# Patient Record
Sex: Female | Born: 1965
Health system: Southern US, Community
[De-identification: ages and names within clinical notes are randomized; demographics above are authoritative.]

## PROBLEM LIST (undated history)

## (undated) DIAGNOSIS — M7070 Other bursitis of hip, unspecified hip: Secondary | ICD-10-CM

## (undated) DIAGNOSIS — Q51818 Other congenital malformations of uterus: Secondary | ICD-10-CM

## (undated) DIAGNOSIS — I10 Essential (primary) hypertension: Secondary | ICD-10-CM

## (undated) DIAGNOSIS — N83209 Unspecified ovarian cyst, unspecified side: Secondary | ICD-10-CM

## (undated) DIAGNOSIS — I350 Nonrheumatic aortic (valve) stenosis: Secondary | ICD-10-CM

## (undated) HISTORY — PX: ROSS KONNO PROCEDURE: SHX2364

## (undated) HISTORY — DX: Unspecified ovarian cyst, unspecified side: N83.209

## (undated) HISTORY — PX: PELVIC LAPAROSCOPY: SHX162

## (undated) HISTORY — PX: CARDIAC VALVE SURGERY: SHX40

## (undated) HISTORY — PX: WRIST SURGERY: SHX841

## (undated) HISTORY — DX: Other bursitis of hip, unspecified hip: M70.70

## (undated) HISTORY — PX: CARDIAC CATHETERIZATION: SHX172

## (undated) HISTORY — DX: Essential (primary) hypertension: I10

## (undated) HISTORY — DX: Other congenital malformations of uterus: Q51.818

---

## 1999-01-25 ENCOUNTER — Inpatient Hospital Stay (HOSPITAL_COMMUNITY): Admission: AD | Admit: 1999-01-25 | Discharge: 1999-01-25 | Payer: Self-pay | Admitting: Obstetrics & Gynecology

## 2000-02-22 ENCOUNTER — Other Ambulatory Visit: Admission: RE | Admit: 2000-02-22 | Discharge: 2000-02-22 | Payer: Self-pay | Admitting: Obstetrics and Gynecology

## 2000-03-28 ENCOUNTER — Other Ambulatory Visit: Admission: RE | Admit: 2000-03-28 | Discharge: 2000-03-28 | Payer: Self-pay | Admitting: Obstetrics and Gynecology

## 2000-07-05 HISTORY — PX: CARDIAC SURGERY: SHX584

## 2000-07-19 ENCOUNTER — Other Ambulatory Visit: Admission: RE | Admit: 2000-07-19 | Discharge: 2000-07-19 | Payer: Self-pay | Admitting: Obstetrics and Gynecology

## 2001-06-05 ENCOUNTER — Encounter: Payer: Self-pay | Admitting: *Deleted

## 2001-06-05 ENCOUNTER — Emergency Department (HOSPITAL_COMMUNITY): Admission: EM | Admit: 2001-06-05 | Discharge: 2001-06-05 | Payer: Self-pay | Admitting: *Deleted

## 2001-06-20 ENCOUNTER — Encounter (HOSPITAL_COMMUNITY): Admission: RE | Admit: 2001-06-20 | Discharge: 2001-09-18 | Payer: Self-pay | Admitting: *Deleted

## 2001-08-31 ENCOUNTER — Other Ambulatory Visit: Admission: RE | Admit: 2001-08-31 | Discharge: 2001-08-31 | Payer: Self-pay | Admitting: Obstetrics and Gynecology

## 2001-09-19 ENCOUNTER — Encounter (HOSPITAL_COMMUNITY): Admission: RE | Admit: 2001-09-19 | Discharge: 2001-12-18 | Payer: Self-pay | Admitting: *Deleted

## 2002-02-26 ENCOUNTER — Other Ambulatory Visit: Admission: RE | Admit: 2002-02-26 | Discharge: 2002-02-26 | Payer: Self-pay | Admitting: Obstetrics and Gynecology

## 2002-09-19 ENCOUNTER — Other Ambulatory Visit: Admission: RE | Admit: 2002-09-19 | Discharge: 2002-09-19 | Payer: Self-pay | Admitting: Obstetrics and Gynecology

## 2003-03-26 ENCOUNTER — Encounter: Payer: Self-pay | Admitting: Emergency Medicine

## 2003-03-26 ENCOUNTER — Emergency Department (HOSPITAL_COMMUNITY): Admission: EM | Admit: 2003-03-26 | Discharge: 2003-03-26 | Payer: Self-pay | Admitting: Emergency Medicine

## 2003-09-26 ENCOUNTER — Other Ambulatory Visit: Admission: RE | Admit: 2003-09-26 | Discharge: 2003-09-26 | Payer: Self-pay | Admitting: Obstetrics and Gynecology

## 2003-10-17 ENCOUNTER — Ambulatory Visit (HOSPITAL_COMMUNITY): Admission: RE | Admit: 2003-10-17 | Discharge: 2003-10-17 | Payer: Self-pay | Admitting: Cardiovascular Disease

## 2004-05-12 ENCOUNTER — Ambulatory Visit: Payer: Self-pay | Admitting: Internal Medicine

## 2004-09-28 ENCOUNTER — Other Ambulatory Visit: Admission: RE | Admit: 2004-09-28 | Discharge: 2004-09-28 | Payer: Self-pay | Admitting: Obstetrics and Gynecology

## 2004-10-06 ENCOUNTER — Encounter: Admission: RE | Admit: 2004-10-06 | Discharge: 2004-10-06 | Payer: Self-pay | Admitting: Obstetrics and Gynecology

## 2004-10-09 ENCOUNTER — Encounter: Admission: RE | Admit: 2004-10-09 | Discharge: 2004-10-09 | Payer: Self-pay | Admitting: Obstetrics and Gynecology

## 2004-12-17 ENCOUNTER — Encounter: Admission: RE | Admit: 2004-12-17 | Discharge: 2005-01-26 | Payer: Self-pay | Admitting: Family Medicine

## 2005-03-25 ENCOUNTER — Encounter: Admission: RE | Admit: 2005-03-25 | Discharge: 2005-05-13 | Payer: Self-pay | Admitting: Family Medicine

## 2005-09-30 ENCOUNTER — Other Ambulatory Visit: Admission: RE | Admit: 2005-09-30 | Discharge: 2005-09-30 | Payer: Self-pay | Admitting: Addiction Medicine

## 2005-11-09 ENCOUNTER — Encounter: Admission: RE | Admit: 2005-11-09 | Discharge: 2005-11-09 | Payer: Self-pay | Admitting: Obstetrics and Gynecology

## 2005-12-16 ENCOUNTER — Encounter: Admission: RE | Admit: 2005-12-16 | Discharge: 2006-01-31 | Payer: Self-pay | Admitting: Family Medicine

## 2006-08-30 ENCOUNTER — Encounter: Admission: RE | Admit: 2006-08-30 | Discharge: 2006-08-30 | Payer: Self-pay | Admitting: Cardiology

## 2006-10-03 ENCOUNTER — Other Ambulatory Visit: Admission: RE | Admit: 2006-10-03 | Discharge: 2006-10-03 | Payer: Self-pay | Admitting: Obstetrics and Gynecology

## 2006-12-17 ENCOUNTER — Emergency Department (HOSPITAL_COMMUNITY): Admission: EM | Admit: 2006-12-17 | Discharge: 2006-12-17 | Payer: Self-pay | Admitting: *Deleted

## 2007-07-25 ENCOUNTER — Ambulatory Visit (HOSPITAL_COMMUNITY): Admission: RE | Admit: 2007-07-25 | Discharge: 2007-07-25 | Payer: Self-pay | Admitting: Obstetrics and Gynecology

## 2007-10-04 ENCOUNTER — Other Ambulatory Visit: Admission: RE | Admit: 2007-10-04 | Discharge: 2007-10-04 | Payer: Self-pay | Admitting: Obstetrics and Gynecology

## 2007-12-25 ENCOUNTER — Encounter: Admission: RE | Admit: 2007-12-25 | Discharge: 2007-12-25 | Payer: Self-pay | Admitting: Orthopedic Surgery

## 2008-03-19 ENCOUNTER — Encounter: Admission: RE | Admit: 2008-03-19 | Discharge: 2008-06-17 | Payer: Self-pay | Admitting: Family Medicine

## 2008-09-05 ENCOUNTER — Ambulatory Visit (HOSPITAL_COMMUNITY): Admission: RE | Admit: 2008-09-05 | Discharge: 2008-09-05 | Payer: Self-pay | Admitting: Obstetrics and Gynecology

## 2008-10-15 ENCOUNTER — Other Ambulatory Visit: Admission: RE | Admit: 2008-10-15 | Discharge: 2008-10-15 | Payer: Self-pay | Admitting: Obstetrics and Gynecology

## 2008-10-15 ENCOUNTER — Ambulatory Visit: Payer: Self-pay | Admitting: Obstetrics and Gynecology

## 2008-10-15 ENCOUNTER — Encounter: Payer: Self-pay | Admitting: Obstetrics and Gynecology

## 2009-06-10 ENCOUNTER — Emergency Department (HOSPITAL_COMMUNITY): Admission: EM | Admit: 2009-06-10 | Discharge: 2009-06-10 | Payer: Self-pay | Admitting: Emergency Medicine

## 2009-09-26 ENCOUNTER — Ambulatory Visit (HOSPITAL_COMMUNITY): Admission: RE | Admit: 2009-09-26 | Discharge: 2009-09-26 | Payer: Self-pay | Admitting: Obstetrics and Gynecology

## 2009-10-20 ENCOUNTER — Ambulatory Visit: Payer: Self-pay | Admitting: Obstetrics and Gynecology

## 2009-10-20 ENCOUNTER — Other Ambulatory Visit: Admission: RE | Admit: 2009-10-20 | Discharge: 2009-10-20 | Payer: Self-pay | Admitting: Obstetrics and Gynecology

## 2010-07-26 ENCOUNTER — Encounter: Payer: Self-pay | Admitting: Cardiovascular Disease

## 2010-10-06 LAB — POCT I-STAT, CHEM 8
Calcium, Ion: 1.07 mmol/L — ABNORMAL LOW (ref 1.12–1.32)
Chloride: 99 mEq/L (ref 96–112)
Hemoglobin: 17.3 g/dL — ABNORMAL HIGH (ref 12.0–15.0)
Sodium: 138 mEq/L (ref 135–145)

## 2011-03-03 ENCOUNTER — Encounter: Payer: Self-pay | Admitting: Gynecology

## 2011-03-03 DIAGNOSIS — I10 Essential (primary) hypertension: Secondary | ICD-10-CM | POA: Insufficient documentation

## 2011-03-03 DIAGNOSIS — N83209 Unspecified ovarian cyst, unspecified side: Secondary | ICD-10-CM | POA: Insufficient documentation

## 2011-03-03 DIAGNOSIS — Q51818 Other congenital malformations of uterus: Secondary | ICD-10-CM | POA: Insufficient documentation

## 2011-03-12 ENCOUNTER — Encounter (HOSPITAL_BASED_OUTPATIENT_CLINIC_OR_DEPARTMENT_OTHER): Payer: Self-pay | Admitting: Emergency Medicine

## 2011-03-12 ENCOUNTER — Encounter: Payer: Self-pay | Admitting: Obstetrics and Gynecology

## 2011-03-12 ENCOUNTER — Emergency Department (HOSPITAL_BASED_OUTPATIENT_CLINIC_OR_DEPARTMENT_OTHER)
Admission: EM | Admit: 2011-03-12 | Discharge: 2011-03-13 | Disposition: A | Discharge status: Home / Self Care | Payer: 59 | Attending: Emergency Medicine | Admitting: Emergency Medicine

## 2011-03-12 ENCOUNTER — Emergency Department (INDEPENDENT_AMBULATORY_CARE_PROVIDER_SITE_OTHER): Payer: 59

## 2011-03-12 DIAGNOSIS — W540XXA Bitten by dog, initial encounter: Secondary | ICD-10-CM

## 2011-03-12 DIAGNOSIS — IMO0002 Reserved for concepts with insufficient information to code with codable children: Secondary | ICD-10-CM

## 2011-03-12 DIAGNOSIS — S8010XA Contusion of unspecified lower leg, initial encounter: Secondary | ICD-10-CM | POA: Insufficient documentation

## 2011-03-12 DIAGNOSIS — I10 Essential (primary) hypertension: Secondary | ICD-10-CM | POA: Insufficient documentation

## 2011-03-12 DIAGNOSIS — E039 Hypothyroidism, unspecified: Secondary | ICD-10-CM | POA: Insufficient documentation

## 2011-03-12 DIAGNOSIS — Y92009 Unspecified place in unspecified non-institutional (private) residence as the place of occurrence of the external cause: Secondary | ICD-10-CM | POA: Insufficient documentation

## 2011-03-12 DIAGNOSIS — S91009A Unspecified open wound, unspecified ankle, initial encounter: Secondary | ICD-10-CM

## 2011-03-12 HISTORY — DX: Nonrheumatic aortic (valve) stenosis: I35.0

## 2011-03-12 NOTE — ED Notes (Addendum)
Pt has dog bite to right lower leg. Pt has irrigated wound with soap and water and peroxide PTA

## 2011-03-12 NOTE — ED Provider Notes (Signed)
History     CSN: [id number] Arrival date & time: 03/12/2011 10:43 PM  Chief Complaint  Patient presents with  . Animal Bite   Patient is a 45 y.o. female presenting with animal bite. The history is provided by the patient. No language interpreter was used.  Animal Bite  The incident occurred today. The incident occurred at home. She came to the ER via personal transport. There is an injury to the right lower leg. The patient is experiencing no pain. It is unlikely that a foreign body is present. Associated symptoms include loss of consciousness. Pertinent negatives include no chest pain, no fussiness, no abdominal pain, no nausea, no vomiting, no inability to bear weight, no pain when bearing weight, no focal weakness, no decreased responsiveness and no light-headedness. Her tetanus status is unknown. There were no sick contacts. She has received no recent medical care.    Past Medical History  Diagnosis Date  . Thyroid disease     Hypothyroid  . Rudimentary uterus     Agenesis of vagina  . Ovarian cyst     Left  . Hypertension   . Aortic stenosis     Past Surgical History  Procedure Date  . Wrist surgery   . Cardiac surgery 2002    Valve replacement  . Pelvic laparoscopy 91,97     Diag. Lap X 2-Ovarian cyst  . Bridgette Habermann procedure     Family History  Problem Relation Age of Onset  . Diabetes Father   . Hypertension Father   . Heart disease Father   . Hypertension Sister   . Breast cancer Maternal Grandmother   . Breast cancer Paternal Grandmother     History  Substance Use Topics  . Smoking status: Never Smoker   . Smokeless tobacco: Not on file  . Alcohol Use: No    OB History    Grav Para Term Preterm Abortions TAB SAB Ect Mult Living   0               Review of Systems  Constitutional: Negative for activity change and decreased responsiveness.  HENT: Negative for facial swelling.   Eyes: Negative for discharge.  Respiratory: Negative for apnea.     Cardiovascular: Negative for chest pain.  Gastrointestinal: Negative for nausea, vomiting, abdominal pain and abdominal distention.  Genitourinary: Negative for difficulty urinating and dyspareunia.  Musculoskeletal: Negative for back pain and arthralgias.  Neurological: Positive for loss of consciousness. Negative for focal weakness and light-headedness.  Hematological: Negative for adenopathy.  Psychiatric/Behavioral: Negative for agitation.    Physical Exam  BP 120/71  Pulse 81  Temp(Src) 98.1 F (36.7 C) (Oral)  Resp 18  SpO2 97%  Physical Exam  Constitutional: She is oriented to person, place, and time. She appears well-developed and well-nourished.  HENT:  Head: Normocephalic and atraumatic.  Eyes: EOM are normal. Pupils are equal, round, and reactive to light.  Neck: Normal range of motion. Neck supple.  Cardiovascular: Normal rate and regular rhythm.  Exam reveals no friction rub.   Pulmonary/Chest: Effort normal and breath sounds normal. She has no wheezes.  Abdominal: Soft. Bowel sounds are normal. There is no guarding.  Musculoskeletal: Normal range of motion. She exhibits no edema.       Puncture wound and superficial 1 cm laceration to R lateral shin just distal to the knee  Neurological: She is alert and oriented to person, place, and time.  Skin: Skin is warm and dry.  Psychiatric: She has  a normal mood and affect.    ED Course  Procedures  MDM Case d/w Dr. Maurice March of Infection disease, Avelox 400 mg Po x 7 days Follow up with orthopedic surgery in 2 days patient verbalizes understanding and agrees to follow up     Gio Janoski Smitty Cords, MD 03/13/11 0030

## 2011-03-13 MED ORDER — TETANUS-DIPHTH-ACELL PERTUSSIS 5-2.5-18.5 LF-MCG/0.5 IM SUSP
0.5000 mL | Freq: Once | INTRAMUSCULAR | Status: AC
  Administered 2011-03-13: 0.5 mL via INTRAMUSCULAR
  Filled 2011-03-13: qty 0.5

## 2011-03-13 MED ORDER — MOXIFLOXACIN HCL 400 MG PO TABS: 400.0000 mg | ORAL_TABLET | Freq: Every day | ORAL | Status: AC

## 2011-03-13 MED ORDER — MUPIROCIN CALCIUM 2 % EX CREA: TOPICAL_CREAM | Freq: Three times a day (TID) | CUTANEOUS | Status: AC

## 2011-04-16 ENCOUNTER — Encounter: Payer: Self-pay | Admitting: Obstetrics and Gynecology

## 2011-04-22 LAB — POCT I-STAT CREATININE
Creatinine, Ser: 1
Operator id: 257131

## 2011-04-22 LAB — I-STAT 8, (EC8 V) (CONVERTED LAB)
Acid-Base Excess: 3 — ABNORMAL HIGH
BUN: 13
Chloride: 106
HCT: 45
Hemoglobin: 15.3 — ABNORMAL HIGH
Operator id: 257131
Sodium: 137

## 2011-04-22 LAB — APTT: aPTT: 29

## 2011-04-22 LAB — CBC
Hemoglobin: 14.5
MCHC: 33.8
MCV: 88.5
WBC: 10.4

## 2011-04-22 LAB — DIFFERENTIAL
Basophils Absolute: 0.1
Lymphs Abs: 1.9
Monocytes Absolute: 0.9 — ABNORMAL HIGH
Monocytes Relative: 9
Neutro Abs: 7.3

## 2011-04-22 LAB — PROTIME-INR: INR: 1

## 2011-04-22 LAB — POCT CARDIAC MARKERS
CKMB, poc: 1.3
Troponin i, poc: <0.05

## 2011-05-12 ENCOUNTER — Other Ambulatory Visit (HOSPITAL_COMMUNITY)
Admission: RE | Admit: 2011-05-12 | Discharge: 2011-05-12 | Disposition: A | Discharge status: Home / Self Care | Payer: 59 | Source: Ambulatory Visit | Attending: Obstetrics and Gynecology | Admitting: Obstetrics and Gynecology

## 2011-05-12 ENCOUNTER — Ambulatory Visit (INDEPENDENT_AMBULATORY_CARE_PROVIDER_SITE_OTHER): Payer: 59 | Admitting: Obstetrics and Gynecology

## 2011-05-12 ENCOUNTER — Encounter: Payer: Self-pay | Admitting: Obstetrics and Gynecology

## 2011-05-12 VITALS — BP 150/96 | Ht 64.0 in | Wt 205.0 lb

## 2011-05-12 DIAGNOSIS — Z954 Presence of other heart-valve replacement: Secondary | ICD-10-CM | POA: Insufficient documentation

## 2011-05-12 DIAGNOSIS — R823 Hemoglobinuria: Secondary | ICD-10-CM

## 2011-05-12 DIAGNOSIS — Z952 Presence of prosthetic heart valve: Secondary | ICD-10-CM

## 2011-05-12 DIAGNOSIS — Z01419 Encounter for gynecological examination (general) (routine) without abnormal findings: Secondary | ICD-10-CM

## 2011-05-12 NOTE — Progress Notes (Addendum)
Patient came to see me today for her annual GYN exam. She has vaginal agenesis with a rudimentary uterus. Due to this she has no periods. She has not had a mammogram yet this year. She still is hopefully get pregnant with someone else carrying a pregnancy. Her pulmonary valve is now getting tight again. She is on increasing medication. She is to have a stent done at [facility] to open it more. She is sexually active and is doing fine that way.  HEENT: Within normal limits. Kennon Portela present Neck: No masses. Supraclavicular lymph nodes: Not enlarged. Breasts: Examined in both sitting and lying position. Symmetrical without skin changes or masses. Abdomen: Soft no masses guarding or rebound. No hernias. Pelvic: External within normal limits. BUS within normal limits. Vaginal examination shows good estrogen effect, the length of the vagina is short Cervix and uterus absent in terms of exam Adnexa within normal limits. Rectovaginal confirmatory. Extremities within normal limits.  Assessment: Vaginal agenesis with rudimentary uterus.  Plan: Discussed amh test to assess ovarian reserve. She will inform. Mammogram Patient's blood pressure was increased since she forgot her medication-normally it is normal

## 2011-05-12 NOTE — Progress Notes (Signed)
Addended byCammie Mcgee T on: 05/12/2011 04:55 PM   Modules accepted: Orders

## 2011-08-14 ENCOUNTER — Other Ambulatory Visit: Payer: Self-pay | Admitting: Cardiology

## 2011-08-27 ENCOUNTER — Other Ambulatory Visit: Payer: Self-pay | Admitting: Obstetrics and Gynecology

## 2011-08-27 ENCOUNTER — Telehealth: Payer: Self-pay | Admitting: *Deleted

## 2011-08-27 DIAGNOSIS — Z780 Asymptomatic menopausal state: Secondary | ICD-10-CM

## 2011-08-27 NOTE — Telephone Encounter (Signed)
Pt called wanting to let you know she is ready proceed with her AMH test to check for ovarian reserve. Please advise

## 2011-08-27 NOTE — Telephone Encounter (Signed)
Pt informed okay to have amh test done, order placed in computer by Dr.G. Pt will call back to make appointment.

## 2011-09-02 ENCOUNTER — Other Ambulatory Visit: Payer: 59

## 2011-09-02 DIAGNOSIS — Z780 Asymptomatic menopausal state: Secondary | ICD-10-CM

## 2011-09-07 LAB — ANTI MULLERIAN HORMONE: AMH AssessR: <0.16 ng/mL

## 2011-10-22 ENCOUNTER — Ambulatory Visit (HOSPITAL_COMMUNITY)
Admission: RE | Admit: 2011-10-22 | Discharge: 2011-10-22 | Disposition: A | Discharge status: Home / Self Care | Payer: 59 | Source: Ambulatory Visit | Attending: Cardiovascular Disease | Admitting: Cardiovascular Disease

## 2011-10-22 ENCOUNTER — Other Ambulatory Visit (HOSPITAL_COMMUNITY): Payer: Self-pay | Admitting: Cardiovascular Disease

## 2011-10-22 DIAGNOSIS — Z954 Presence of other heart-valve replacement: Secondary | ICD-10-CM

## 2011-10-22 DIAGNOSIS — I517 Cardiomegaly: Secondary | ICD-10-CM | POA: Insufficient documentation

## 2011-10-22 DIAGNOSIS — Z09 Encounter for follow-up examination after completed treatment for conditions other than malignant neoplasm: Secondary | ICD-10-CM | POA: Insufficient documentation

## 2011-10-29 ENCOUNTER — Telehealth: Payer: Self-pay | Admitting: *Deleted

## 2011-10-29 NOTE — Telephone Encounter (Signed)
Chart

## 2011-10-29 NOTE — Telephone Encounter (Signed)
Chart on your desk.

## 2011-10-29 NOTE — Telephone Encounter (Signed)
Pt would like for you to call her at you convenience [phone], pt said it is regarding the Doctor you told her to go see Dr. Cloyde Reams. She said it was private. Please advise

## 2011-11-03 ENCOUNTER — Telehealth: Payer: Self-pay | Admitting: Obstetrics and Gynecology

## 2011-11-03 NOTE — Telephone Encounter (Signed)
Patient called me last week after being seen by [facility] infertility very upset. Dr. Andi Devon  had told her that based on her amh she did not feel that she could fertilize her fiancee's sperm. She asked me why no other tests were done. I told her it was because the physician did not feel other testing would  change her decision. She seemed very angry with both her fianc for delaying allowing her to pursue her fertility and also at the physician at [facility]. We discussed donor egg  and she said she was not interested. She only wanted to have a child that had her genes. She said life was over for her. I told her that even though this was less than ideal she should not feel this was the end of her world and certainly having a child with either donor egg or adoption would allow her to be a parent. This did not seem to please her. We discussed that both Dr. Karlene Lineman   in 2005 and I had frequently reminded her that time was of an essence with her fertility prior to this year. She wanted a second opinion and I suggested she go back and see Dr. Karlene Lineman. I also told her I would get her records from 2009 when I had her see Dr. April Manson and then I would call her back.  I received her records from July of 2009 when she saw  Him. Her amh level  was normal at 1.2 with an FSH of 7.5 and estradiol of 50. He explained to her then she should not wait which he reiterated to me today when I spoke to him. He had made her aware of the extreme cost with legal fees etc. When a gestational carrier is necessary of approximately $60,000 and he wasn't sure she could  afford it. He had tried to get her in contact with an agnecy  which could help her with these issues. I have left her a message to call me so I could explain this further conversation with her.

## 2011-11-03 NOTE — Telephone Encounter (Signed)
Patient called me back up and I relayed to her my conversation with Dr. April Manson. She understands. She has however been having ovarian pain several times in the past several months and she wondered why. I told her she could have an ovarian problem and she will call and schedule ultrasound. I believe we should do an FSH the same day.

## 2011-12-10 ENCOUNTER — Other Ambulatory Visit: Payer: Self-pay | Admitting: Cardiology

## 2011-12-12 ENCOUNTER — Other Ambulatory Visit: Payer: Self-pay | Admitting: Cardiology

## 2012-03-17 ENCOUNTER — Other Ambulatory Visit: Payer: Self-pay | Admitting: Obstetrics and Gynecology

## 2012-03-17 DIAGNOSIS — Z1231 Encounter for screening mammogram for malignant neoplasm of breast: Secondary | ICD-10-CM

## 2012-03-23 ENCOUNTER — Ambulatory Visit (HOSPITAL_COMMUNITY): Payer: 59

## 2012-04-03 ENCOUNTER — Ambulatory Visit (HOSPITAL_COMMUNITY)
Admission: RE | Admit: 2012-04-03 | Discharge: 2012-04-03 | Disposition: A | Discharge status: Home / Self Care | Payer: 59 | Source: Ambulatory Visit | Attending: Obstetrics and Gynecology | Admitting: Obstetrics and Gynecology

## 2012-04-03 DIAGNOSIS — Z1231 Encounter for screening mammogram for malignant neoplasm of breast: Secondary | ICD-10-CM | POA: Insufficient documentation

## 2012-05-16 ENCOUNTER — Encounter: Payer: Self-pay | Admitting: Obstetrics and Gynecology

## 2012-05-16 ENCOUNTER — Other Ambulatory Visit (HOSPITAL_COMMUNITY)
Admission: RE | Admit: 2012-05-16 | Discharge: 2012-05-16 | Disposition: A | Discharge status: Home / Self Care | Payer: 59 | Source: Ambulatory Visit | Attending: Obstetrics and Gynecology | Admitting: Obstetrics and Gynecology

## 2012-05-16 ENCOUNTER — Ambulatory Visit (INDEPENDENT_AMBULATORY_CARE_PROVIDER_SITE_OTHER): Payer: 59 | Admitting: Obstetrics and Gynecology

## 2012-05-16 VITALS — BP 126/80 | Ht 64.0 in | Wt 212.0 lb

## 2012-05-16 DIAGNOSIS — Z01419 Encounter for gynecological examination (general) (routine) without abnormal findings: Secondary | ICD-10-CM

## 2012-05-16 DIAGNOSIS — Q520 Congenital absence of vagina: Secondary | ICD-10-CM | POA: Insufficient documentation

## 2012-05-16 NOTE — Progress Notes (Signed)
Patient came to see me today for her annual GYN exam. She continues to have monthly cramping as if she is ovulating. She has Rokitansky-Kuster-Hauser. In 2010 her FSH was less than 10. For years we discussed egg capture with a surrogate carrying the pregnancy. She saw  Dr. Karlene Lineman at [facility]. She did not want to proceed until she was married. She was warned both by Dr. Karlene Lineman and me that she could not delay too long. She has been involved with a man for many years but evidently he was not ready to get married. When he was ready to commit she went and saw her Dr. Andi Devon Who did not want to proceed due to an abnormal AMH. This was in April, 2013. She is now very upset with her significant other because she thinks  he delayed the marriage too long. She is up-to-date on mammograms. She continues to see the cardiologist. In 2001 she had a Pap smear showing low grade vaginal dysplasia without high-risk HPV detected. We did not initially colposcope her but repeated  her Pap and she's had 12 normal Pap smears. Her last Pap was 2012. At some point a CT scan was done for increased echogenicity in the left mid kidney. A small adrenal adenoma on the left adrenal gland was seen. This was April, 2006. Followup limited CT scan in May, 2007 showed a small stable left adrenal adenoma with Dr. Luvenia Starch opinion that no further evaluation was recommended.  HEENT: Within normal limits.Kennon Portela present. Neck: No masses. Supraclavicular lymph nodes: Not enlarged. Breasts: Examined in both sitting and lying position. Symmetrical without skin changes or masses. Abdomen: Soft no masses guarding or rebound. No hernias. Pelvic: External within normal limits. BUS within normal limits. Vaginal examination shows good estrogen effect, no cystocele enterocele or rectocele. Length of vagina is almost normal(pt did dilatation to create a vagina) Cervix and uterus: absent. Adnexa within normal limits. Rectovaginal  confirmatory. Extremities within normal limits.  Assessment: Rokitansky-Hauser syndrome  Plan: Suggest that we repeat  FSH to be sure her monthly cramping is due to ovulation. If FSH was now elevated I suggested she would do imaging. Patient declined. Continue yearly mammograms. Patient will discuss with PCP whether any further followup of adrenal adenoma is needed.The new Pap smear guidelines were discussed with the patient. Pap done at patient's request.

## 2012-05-16 NOTE — Patient Instructions (Signed)
Continue yearly mammograms 

## 2012-05-17 ENCOUNTER — Encounter: Payer: Self-pay | Admitting: Obstetrics and Gynecology

## 2012-05-17 LAB — URINALYSIS W MICROSCOPIC + REFLEX CULTURE
Bacteria, UA: NONE SEEN
Casts: NONE SEEN
Glucose, UA: NEGATIVE mg/dL
Hgb urine dipstick: NEGATIVE
Ketones, ur: NEGATIVE mg/dL
Leukocytes, UA: NEGATIVE
Nitrite: NEGATIVE
Protein, ur: NEGATIVE mg/dL
pH: 5.5 (ref 5.0–8.0)

## 2012-08-30 ENCOUNTER — Ambulatory Visit (HOSPITAL_COMMUNITY): Payer: 59 | Attending: Cardiovascular Disease

## 2012-08-30 DIAGNOSIS — I379 Nonrheumatic pulmonary valve disorder, unspecified: Secondary | ICD-10-CM

## 2012-10-09 ENCOUNTER — Encounter: Payer: Self-pay | Admitting: Gynecology

## 2012-10-09 ENCOUNTER — Ambulatory Visit (INDEPENDENT_AMBULATORY_CARE_PROVIDER_SITE_OTHER): Payer: 59 | Admitting: Gynecology

## 2012-10-09 VITALS — BP 130/82

## 2012-10-09 DIAGNOSIS — Z7721 Contact with and (suspected) exposure to potentially hazardous body fluids: Secondary | ICD-10-CM

## 2012-10-09 NOTE — Patient Instructions (Signed)
Hepatitis C Hepatitis C is a viral infection of the liver. Infection may go undetected for months or years because symptoms may be absent or very mild. Chronic liver disease is the main danger of hepatitis C. This may lead to scarring of the liver (cirrhosis), liver failure, and liver cancer. CAUSES  Hepatitis C is caused by the hepatitis C virus (HCV). Formerly, hepatitis C infections were most commonly transmitted through blood transfusions. In the early 1990s, routine testing of donated blood for hepatitis C and exclusion of blood that tests positive for HCV began. Now, HCV is most commonly transmitted from person to person through injection drug use, sharing needles, or sex with an infected person. A caregiver may also get the infection from exposure to the blood of an infected patient by way of a cut or needle stick.  SYMPTOMS  Acute Phase Many cases of acute HCV infection are mild and cause few problems.Some people may not even realize they are sick.Symptoms in others may last a few weeks to several months and include:  Feeling very tired.  Loss of appetite.  Nausea.  Vomiting.  Abdominal pain.  Dark yellow urine.  Yellow skin and eyes (jaundice).  Itching of the skin. Chronic Phase  Between 50% to 85% of people who get HCV infection become "chronic carriers." They often have no symptoms, but the virus stays in their body.They may spread the virus to others and can get long-term liver disease.  Many people with chronic HCV infection remain healthy for many years. However, up to 1 in 5 chronically infected people may develop severe liver diseases including scarring of the liver (cirrhosis), liver failure, or liver cancer. DIAGNOSIS  Diagnosis of hepatitis C infection is made by testing blood for the presence of hepatitis C viral particles called RNA. Other tests may also be done to measure the status of current liver function, exclude other liver problems, or assess liver  damage. TREATMENT  Treatment with many antiviral drugs is available and recommended for some patients with chronic HCV infection. Drug treatment is generally considered appropriate for patients who:  Are 65 years of age or older.  Have a positive test for HCV particles in the blood.  Have a liver tissue sample (biopsy) that shows chronic hepatitis and significant scarring (fibrosis).  Do not have signs of liver failure.  Have acceptable blood test results that confirm the wellness of other body organs.  Are willing to be treated and conform to treatment requirements.  Have no other circumstances that would prevent treatment from being recommended (contraindications). All people who are offered and choose to receive drug treatment must understand that careful medical follow up for many months and even years is crucial in order to make successful care possible. The goal of drug treatment is to eliminate any evidence of HCV in the blood on a long-term basis. This is called a "sustained virologic response" or SVR. Achieving a SVR is associated with a decrease in the chance of life-threatening liver problems, need for a liver transplant, liver cancer rates, and liver-related complications. Successful treatment currently requires taking treatment drugs for at least 24 weeks and up to 72 weeks. An injected drug (interferon) given weekly and an oral antiviral medicine taken daily are usually prescribed. Side effects from these drugs are common and some may be very serious. Your response to treatment must be carefully monitored by both you and your caregiver throughout the entire treatment period. PREVENTION There is no vaccine for hepatitis C. The only  way to prevent the disease is to reduce the risk of exposure to the virus.   Avoid sharing drug needles or personal items like toothbrushes, razors, and nail clippers with an infected person.  Healthcare workers need to avoid injuries and wear  appropriate protective equipment such as gloves, gowns, and face masks when performing invasive medical or nursing procedures. HOME CARE INSTRUCTIONS  To avoid making your liver disease worse:  Strictly avoid drinking alcohol.  Carefully review all new prescriptions of medicines with your caregiver. Ask your caregiver which drugs you should avoid. The following drugs are toxic to the liver, and your caregiver may tell you to avoid them:  Isoniazid.  Methyldopa.  Acetaminophen.  Anabolic steroids (muscle-building drugs).  Erythromycin.  Oral contraceptives (birth control pills).  Check with your caregiver to make sure medicine you are currently taking will not be harmful.  Periodic blood tests may be required. Follow your caregiver's advice about when you should have blood tests.  Avoid a sexual relationship until advised otherwise by your caregiver.  Avoid activities that could expose other people to your blood. Examples include sharing a toothbrush, nail clippers, razors, and needles.  Bed rest is not necessary, but it may make you feel better. Recovery time is not related to the amount of rest you receive.  This infection is contagious. Follow your caregiver's instructions in order to avoid spread of the infection. SEEK IMMEDIATE MEDICAL CARE IF:  You have increasing fatigue or weakness.  You have an oral temperature above 102 F (38.9 C), not controlled by medicine.  You develop loss of appetite, nausea, or vomiting.  You develop jaundice.  You develop easy bruising or bleeding.  You develop any severe problems as a result of your treatment. MAKE SURE YOU:   Understand these instructions.  Will watch your condition.  Will get help right away if you are not doing well or get worse. Document Released: 06/18/2000 Document Revised: 09/13/2011 Document Reviewed: 10/21/2010 [facility] Patient Information 2013 [place], [place].  New CDC guidelines is recommending  patients be tested once in her lifetime for hepatitis C antibody who were born between [birth year] through 1964. This was discussed with the patient today.

## 2012-10-09 NOTE — Progress Notes (Signed)
Patient is a 47 roll today who presented to the office i with no complaints with the exception that her fianc has informed her that a year and a half ago she had been exposed to hepatitis C from his son. Patient is concerned about her exposure by having intercourse with her fianc although she is asymptomatic.  We are going to test her today for with hepatitis panel as well as HIV screen. She will return back to the office in November which is due for her annual exam.She has history of Rokitansky-Kuster-Hauser syndrome.

## 2012-10-10 LAB — HEPATITIS PANEL, ACUTE
Hep B C IgM: NEGATIVE
Hepatitis B Surface Ag: NEGATIVE

## 2012-10-10 LAB — HIV ANTIBODY (ROUTINE TESTING W REFLEX): HIV: NONREACTIVE

## 2013-04-19 ENCOUNTER — Other Ambulatory Visit: Payer: Self-pay | Admitting: Gynecology

## 2013-04-19 DIAGNOSIS — Z1231 Encounter for screening mammogram for malignant neoplasm of breast: Secondary | ICD-10-CM

## 2013-05-02 ENCOUNTER — Ambulatory Visit (HOSPITAL_COMMUNITY)
Admission: RE | Admit: 2013-05-02 | Discharge: 2013-05-02 | Disposition: A | Discharge status: Home / Self Care | Payer: 59 | Source: Ambulatory Visit | Attending: Gynecology | Admitting: Gynecology

## 2013-05-02 DIAGNOSIS — Z1231 Encounter for screening mammogram for malignant neoplasm of breast: Secondary | ICD-10-CM | POA: Insufficient documentation

## 2013-05-18 ENCOUNTER — Encounter: Payer: 59 | Admitting: Gynecology

## 2013-06-05 ENCOUNTER — Ambulatory Visit (INDEPENDENT_AMBULATORY_CARE_PROVIDER_SITE_OTHER): Payer: 59 | Admitting: Gynecology

## 2013-06-05 ENCOUNTER — Encounter: Payer: Self-pay | Admitting: Gynecology

## 2013-06-05 VITALS — BP 120/84 | Ht 63.5 in | Wt 212.0 lb

## 2013-06-05 DIAGNOSIS — Q520 Congenital absence of vagina: Secondary | ICD-10-CM

## 2013-06-05 DIAGNOSIS — Z01419 Encounter for gynecological examination (general) (routine) without abnormal findings: Secondary | ICD-10-CM

## 2013-06-05 NOTE — Progress Notes (Signed)
Maria Wolfe May 22, 1966 [id number]   History:    47 y.o.  for annual gyn exam was no complaints today. Patient has Rokitansky-Kuster-Hauser syndrome (mllerian dysgenesis). Patient several years ago has had 2 laparoscopic procedures both describing bilateral normal-appearing fallopian tubes and "rudimentary uterine horn bilaterally". There was no report of any endometriosis. Patient has had a functional vagina with the use of dilators and has had no problem with sexual intercourse. Patient had been followed by Dr. Karlene Lineman at [facility] department of reproductive endocrinology and infertility. He had commented in one of his consultation note that pregnancy was not going to be possible because she did not have a functional endometrium despite normal ovarian function. She had never developed a hematocrit each row or endometriosis. She had been interested in the past for surrogacy. Patient was reported to have had an abnormal AMH in April 2013. She is also being followed by the cardiologist whereby she has had prior history of pulmonary valve replacement.  In 2001 she had a Pap smear showing low grade vaginal dysplasia without high-risk HPV detected. We did not initially colposcope her but repeated her Pap and she's had 12 normal Pap smears. Her last Pap was 2012. At some point a CT scan was done for increased echogenicity in the left mid kidney. A small adrenal adenoma on the left adrenal gland was seen. This was April, 2006. Followup limited CT scan in May, 2007 showed a small stable left adrenal adenoma with Dr. Luvenia Starch opinion that no further evaluation was recommended.  Patient would like to repeat her AMH today and to check to see if she has any follicles in her ovaries with an ultrasound at a later date.    Past medical history,surgical history, family history and social history were all reviewed and documented in the EPIC chart.  Gynecologic History No LMP recorded.  Patient is not currently having periods (Reason: Other). Contraception: none Last Pap: 2013. Results were: normal Last mammogram: 2014. Results were: normal  Obstetric History OB History  Gravida Para Term Preterm AB SAB TAB Ectopic Multiple Living  0                  ROS: A ROS was performed and pertinent positives and negatives are included in the history.  GENERAL: No fevers or chills. HEENT: No change in vision, no earache, sore throat or sinus congestion. NECK: No pain or stiffness. CARDIOVASCULAR: No chest pain or pressure. No palpitations. PULMONARY: No shortness of breath, cough or wheeze. GASTROINTESTINAL: No abdominal pain, nausea, vomiting or diarrhea, melena or bright red blood per rectum. GENITOURINARY: No urinary frequency, urgency, hesitancy or dysuria. MUSCULOSKELETAL: No joint or muscle pain, no back pain, no recent trauma. DERMATOLOGIC: No rash, no itching, no lesions. ENDOCRINE: No polyuria, polydipsia, no heat or cold intolerance. No recent change in weight. HEMATOLOGICAL: No anemia or easy bruising or bleeding. NEUROLOGIC: No headache, seizures, numbness, tingling or weakness. PSYCHIATRIC: No depression, no loss of interest in normal activity or change in sleep pattern.     Exam: chaperone present  BP 120/84  Ht 5' 3.5" (1.613 m)  Wt 212 lb (96.163 kg)  BMI 36.96 kg/m2  Body mass index is 36.96 kg/(m^2).  General appearance : Well developed well nourished female. No acute distress HEENT: Neck supple, trachea midline, no carotid bruits, no thyroidmegaly Lungs: Clear to auscultation, no rhonchi or wheezes, or rib retractions  Heart: Regular rate and rhythm, no murmurs or gallops Breast:Examined in sitting  and supine position were symmetrical in appearance, no palpable masses or tenderness,  no skin retraction, no nipple inversion, no nipple discharge, no skin discoloration, no axillary or supraclavicular lymphadenopathy Abdomen: no palpable masses or tenderness, no  rebound or guarding Extremities: no edema or skin discoloration or tenderness  Pelvic:  Bartholin, Urethra, Skene Glands: Within normal limits             Vagina: No gross lesions or discharge  Cervix:absent  Uterus none palpated/rudimentary horn by laparoscopy noted  Adnexa  Without masses or tenderness  Anus and perineum  normal   Rectovaginal  normal sphincter tone without palpated masses or tenderness             Hemoccult none indicated     Assessment/Plan:  47 y.o. female for annual exam with Rokitansky-Kuster-Hauser syndrome. An FSH and AMH will be ordered today and she will return back to the office in a few weeks for ultrasound to see if there is any follicles present. Her PCP has been drawn her lab work so no lab work done today. Pap smear not done today in accordance to the new guidelines. Patient was reminded to do her monthly breast exam.  Note: This dictation was prepared with  Dragon/digital dictation along withSmart phrase technology. Any transcriptional errors that result from this process are unintentional.   Ok Edwards MD, 5:40 PM 06/05/2013

## 2013-06-06 ENCOUNTER — Other Ambulatory Visit: Payer: 59

## 2013-06-06 DIAGNOSIS — Q520 Congenital absence of vagina: Secondary | ICD-10-CM

## 2013-06-06 DIAGNOSIS — Z01419 Encounter for gynecological examination (general) (routine) without abnormal findings: Secondary | ICD-10-CM

## 2013-06-06 LAB — CBC WITH DIFFERENTIAL/PLATELET
Basophils Absolute: 0 10*3/uL (ref 0.0–0.1)
Basophils Relative: 0 % (ref 0–1)
Eosinophils Absolute: 0.1 10*3/uL (ref 0.0–0.7)
Eosinophils Relative: 2 % (ref 0–5)
HCT: 41.6 % (ref 36.0–46.0)
Hemoglobin: 14.7 g/dL (ref 12.0–15.0)
MCH: 31 pg (ref 26.0–34.0)
MCHC: 35.3 g/dL (ref 30.0–36.0)
MCV: 87.8 fL (ref 78.0–100.0)
Monocytes Absolute: 0.5 10*3/uL (ref 0.1–1.0)
Monocytes Relative: 7 % (ref 3–12)
RDW: 13.2 % (ref 11.5–15.5)

## 2013-06-06 LAB — COMPREHENSIVE METABOLIC PANEL
AST: 19 U/L (ref 0–37)
Albumin: 4 g/dL (ref 3.5–5.2)
Alkaline Phosphatase: 100 U/L (ref 39–117)
BUN: 12 mg/dL (ref 6–23)
Creat: 0.99 mg/dL (ref 0.50–1.10)
Glucose, Bld: 87 mg/dL (ref 70–99)
Potassium: 4.2 mEq/L (ref 3.5–5.3)
Total Bilirubin: 0.7 mg/dL (ref 0.3–1.2)

## 2013-06-06 LAB — LIPID PANEL
Cholesterol: 131 mg/dL (ref 0–200)
HDL: 35 mg/dL — ABNORMAL LOW (ref >39)
LDL Cholesterol: 77 mg/dL (ref 0–99)
Total CHOL/HDL Ratio: 3.7 ratio
Triglycerides: 95 mg/dL (ref <150)
VLDL: 19 mg/dL (ref 0–40)

## 2013-06-06 LAB — FOLLICLE STIMULATING HORMONE: FSH: 14.7 m[IU]/mL

## 2013-06-06 LAB — TSH: TSH: 2.161 u[IU]/mL (ref 0.350–4.500)

## 2013-06-07 LAB — URINALYSIS W MICROSCOPIC + REFLEX CULTURE
Bacteria, UA: NONE SEEN
Bilirubin Urine: NEGATIVE
Casts: NONE SEEN
Hgb urine dipstick: NEGATIVE
Ketones, ur: NEGATIVE mg/dL
Specific Gravity, Urine: 1.005 (ref 1.005–1.030)
Urobilinogen, UA: 1 mg/dL (ref 0.0–1.0)

## 2013-06-19 ENCOUNTER — Encounter: Payer: Self-pay | Admitting: Obstetrics and Gynecology

## 2013-06-21 ENCOUNTER — Ambulatory Visit (INDEPENDENT_AMBULATORY_CARE_PROVIDER_SITE_OTHER): Payer: 59 | Admitting: Gynecology

## 2013-06-21 ENCOUNTER — Ambulatory Visit (INDEPENDENT_AMBULATORY_CARE_PROVIDER_SITE_OTHER): Payer: 59

## 2013-06-21 ENCOUNTER — Other Ambulatory Visit: Payer: Self-pay | Admitting: Gynecology

## 2013-06-21 DIAGNOSIS — Q519 Congenital malformation of uterus and cervix, unspecified: Secondary | ICD-10-CM

## 2013-06-21 DIAGNOSIS — N8300 Follicular cyst of ovary, unspecified side: Secondary | ICD-10-CM

## 2013-06-21 DIAGNOSIS — Q520 Congenital absence of vagina: Secondary | ICD-10-CM

## 2013-06-21 DIAGNOSIS — N831 Corpus luteum cyst of ovary, unspecified side: Secondary | ICD-10-CM

## 2013-06-21 DIAGNOSIS — N83201 Unspecified ovarian cyst, right side: Secondary | ICD-10-CM

## 2013-06-21 DIAGNOSIS — N83209 Unspecified ovarian cyst, unspecified side: Secondary | ICD-10-CM

## 2013-06-21 DIAGNOSIS — L909 Atrophic disorder of skin, unspecified: Secondary | ICD-10-CM

## 2013-06-21 DIAGNOSIS — E65 Localized adiposity: Secondary | ICD-10-CM

## 2013-06-22 ENCOUNTER — Encounter: Payer: Self-pay | Admitting: Gynecology

## 2013-06-22 NOTE — Progress Notes (Signed)
   Patient was seen in the office today for an ultrasound since patient is contemplating on in vitro fertilization with surrogate and sperm donor as a result of her history of Rokitansky-Kuster-Hauser Syndrome. She was here for her annual exam on December 3 issue was found to have a normal FSH of 14.7 and normal blood sugar and TSH and her AMH was normal less than 0.16. Review of my former partners notes had indicated that on numerous occasion she had had a discussion with the patient on egg capture with a surrogate carrying the pregnancy. She saw Dr. Karlene Lineman at [facility]. She did not want to proceed until she was married. She was warned both by Dr. Karlene Lineman and Dr. Eda Paschal that she could not delay too long because of her age. She has been involved with a man for many years but evidently he was not ready to get married. When he was ready to commit she went and saw her Dr. Andi Devon Who did not want to proceed due to an abnormal AMH. This was in April, 2013. Her partner has not decided to get married yet. Patient wants to move forward with him or without him if it means getting a firm donor as well. She continues to see the cardiologist. In 2001 she had a Pap smear showing low grade vaginal dysplasia without high-risk HPV detected. We did not initially colposcope her but repeated her Pap and she's had 12 normal Pap smears. Her last Pap was 2012. At some point a CT scan was done for increased echogenicity in the left mid kidney. A small adrenal adenoma on the left adrenal gland was seen. This was April, 2006. Followup limited CT scan in May, 2007 showed a small stable left adrenal adenoma with Dr. Luvenia Starch opinion that no further evaluation was recommended.  The patient's ultrasound demonstrated underdeveloped uterus homogeneous myometrium. Then echogenic endometrium. Right ovary with a small 8 mm follicle was noted along with a thin-walled cyst measuring 26 x 25 x 21 mm with internal low level echoes  with positive color flow to the wall of the cyst. Possible corpus luteum cyst. Left ovary 2 follicles one measuring 20 x 15 mm the second measuring 10 x 10 mm. There was no fluid in the cul-de-sac.  Assessment/plan: 47 year old patient with Rokitansky-Kuster-Hauser syndrome with normal FSH and normal AMH wishing to proceed with in vitro fertilization by the utilization of a surrogate and sperm donor. She will be referred to my reproductive endocrinology colleague Dr. Minda Meo for further discussion and management and to discuss with him in consultation the likelihood of a successful pregnancy with a surrogate. I will send him a copy of these office notes. She should have a followup ultrasound in 3 months on the small right ovarian cyst.

## 2013-09-07 ENCOUNTER — Ambulatory Visit (HOSPITAL_COMMUNITY)
Admission: RE | Admit: 2013-09-07 | Discharge: 2013-09-07 | Disposition: A | Discharge status: Home / Self Care | Payer: 59 | Source: Ambulatory Visit | Attending: Cardiovascular Disease | Admitting: Cardiovascular Disease

## 2013-09-07 ENCOUNTER — Other Ambulatory Visit (HOSPITAL_COMMUNITY): Payer: Self-pay | Admitting: Cardiovascular Disease

## 2013-09-07 DIAGNOSIS — Z954 Presence of other heart-valve replacement: Secondary | ICD-10-CM | POA: Insufficient documentation

## 2013-09-07 DIAGNOSIS — R002 Palpitations: Secondary | ICD-10-CM

## 2013-11-06 ENCOUNTER — Encounter: Payer: Self-pay | Admitting: Cardiovascular Disease

## 2013-12-07 ENCOUNTER — Other Ambulatory Visit (HOSPITAL_COMMUNITY): Payer: Self-pay | Admitting: Cardiovascular Disease

## 2013-12-07 DIAGNOSIS — R002 Palpitations: Secondary | ICD-10-CM

## 2013-12-07 DIAGNOSIS — Z952 Presence of prosthetic heart valve: Secondary | ICD-10-CM

## 2013-12-07 DIAGNOSIS — I370 Nonrheumatic pulmonary valve stenosis: Secondary | ICD-10-CM

## 2013-12-07 DIAGNOSIS — Z954 Presence of other heart-valve replacement: Secondary | ICD-10-CM

## 2013-12-12 ENCOUNTER — Encounter: Payer: Self-pay | Admitting: Cardiovascular Disease

## 2013-12-26 ENCOUNTER — Ambulatory Visit (HOSPITAL_COMMUNITY)
Admission: RE | Admit: 2013-12-26 | Discharge: 2013-12-26 | Disposition: A | Discharge status: Home / Self Care | Payer: 59 | Source: Ambulatory Visit | Attending: Cardiovascular Disease | Admitting: Cardiovascular Disease

## 2013-12-26 DIAGNOSIS — Z952 Presence of prosthetic heart valve: Secondary | ICD-10-CM

## 2013-12-26 DIAGNOSIS — R002 Palpitations: Secondary | ICD-10-CM

## 2013-12-26 DIAGNOSIS — I370 Nonrheumatic pulmonary valve stenosis: Secondary | ICD-10-CM

## 2013-12-26 DIAGNOSIS — Z954 Presence of other heart-valve replacement: Secondary | ICD-10-CM

## 2013-12-26 LAB — CREATININE, SERUM
CREATININE: 0.87 mg/dL (ref 0.50–1.10)
GFR calc Af Amer: >90 mL/min (ref >90)
GFR calc non Af Amer: 78 mL/min — ABNORMAL LOW (ref >90)

## 2013-12-31 DIAGNOSIS — H40013 Open angle with borderline findings, low risk, bilateral: Secondary | ICD-10-CM | POA: Insufficient documentation

## 2014-05-15 ENCOUNTER — Other Ambulatory Visit: Payer: Self-pay | Admitting: Gynecology

## 2014-05-15 DIAGNOSIS — Z1231 Encounter for screening mammogram for malignant neoplasm of breast: Secondary | ICD-10-CM

## 2014-05-21 ENCOUNTER — Ambulatory Visit (HOSPITAL_COMMUNITY)
Admission: RE | Admit: 2014-05-21 | Discharge: 2014-05-21 | Disposition: A | Discharge status: Home / Self Care | Payer: 59 | Source: Ambulatory Visit | Attending: Gynecology | Admitting: Gynecology

## 2014-05-21 DIAGNOSIS — Z1231 Encounter for screening mammogram for malignant neoplasm of breast: Secondary | ICD-10-CM | POA: Insufficient documentation

## 2014-06-18 ENCOUNTER — Encounter: Payer: Self-pay | Admitting: Gynecology

## 2014-06-18 ENCOUNTER — Ambulatory Visit (INDEPENDENT_AMBULATORY_CARE_PROVIDER_SITE_OTHER): Payer: 59 | Admitting: Gynecology

## 2014-06-18 VITALS — BP 136/88 | Ht 63.0 in | Wt 218.0 lb

## 2014-06-18 DIAGNOSIS — Q520 Congenital absence of vagina: Secondary | ICD-10-CM

## 2014-06-18 DIAGNOSIS — Q528 Other specified congenital malformations of female genitalia: Secondary | ICD-10-CM

## 2014-06-18 DIAGNOSIS — Z01419 Encounter for gynecological examination (general) (routine) without abnormal findings: Secondary | ICD-10-CM

## 2014-06-18 NOTE — Progress Notes (Signed)
Maria FellingMargaret D Wolfe Jul 12, 1965 [id number]   History:       48 y.o.  for annual gyn exam with no complaints today. Patient's history as follows:  Patient has Rokitansky-Kuster-Hauser syndrome (mllerian dysgenesis). Patient several years ago has had 2 laparoscopic procedures both describing bilateral normal-appearing fallopian tubes and "rudimentary uterine horn bilaterally". There was no report of any endometriosis. Patient has had a functional vagina with the use of dilators and has had no problem with sexual intercourse. Patient had [facility] department of reproductive endocrinology and infertility. He had commented in one of his consultation note that pregnancy was not going to be possible because she did not have a functional endometrium despite normal ovarian function. She had been interested in the past for surrogacy. Patient was reported to have had an abnormal AMH in April 2013. She is also being followed by the cardiologist whereby she has had prior history of pulmonary valve replacement.  In 2001 she had a Pap smear showing low grade vaginal dysplasia without high-risk HPV detected. We did not initially colposcope her but repeated her Pap and she's had 12 normal Pap smears. Her last Pap was 2012. At some point a CT scan was done for increased echogenicity in the left mid kidney. A small adrenal adenoma on the left adrenal gland was seen. This was April, 2006. Followup limited CT scan in May, 2007 showed a small stable left adrenal adenoma with Dr. Luvenia StarchMaxwell's opinion that no further evaluation was recommended.  Patient had an ultrasound the office 06/22/2014 the following findings: Under developed uterus homogeneous myometrium.Then echogenic endometrium. Right ovary with a small 8 mm follicle was noted along with a thin-walled cyst measuring 26 x 25 x 21 mm with internal low level echoes with positive color flow to the wall of the cyst.  Possible corpus luteum cyst. Left ovary 2 follicles one measuring 20 x 15 mm the second measuring 10 x 10 mm. There was no fluid in the cul-de-sa   Patient last year had a normal FSH and AMH and was referred for in vitro fertilization with the utilization of surrogate and sperm donor with Dr.Dr. Minda Meoammer Yalcinkaya reproductive endocrinologist. She stated she saw him earlier this year and had one stimulated cycle but never completed the procedure because lack of funds. Past medical history,surgical history, family history and social history were all reviewed and documented in the EPIC chart.    Past medical history,surgical history, family history and social history were all reviewed and documented in the EPIC chart.  Gynecologic History No LMP recorded. Patient is not currently having periods (Reason: Other). Contraception: none Last Pap: 2013. Results were: normal Last mammogram: 2015. Results were: normal  Obstetric History OB History  Gravida Para Term Preterm AB SAB TAB Ectopic Multiple Living  0                  ROS: A ROS was performed and pertinent positives and negatives are included in the history.  GENERAL: No fevers or chills. HEENT: No change in vision, no earache, sore throat or sinus congestion. NECK: No pain or stiffness. CARDIOVASCULAR: No chest pain or pressure. No palpitations. PULMONARY: No shortness of breath, cough or wheeze. GASTROINTESTINAL: No abdominal pain, nausea, vomiting or diarrhea, melena or bright red blood per rectum. GENITOURINARY: No urinary frequency, urgency, hesitancy or dysuria. MUSCULOSKELETAL: No joint or muscle pain, no back pain, no recent trauma. DERMATOLOGIC: No rash, no itching, no lesions.  ENDOCRINE: No polyuria, polydipsia, no heat or cold intolerance. No recent change in weight. HEMATOLOGICAL: No anemia or easy bruising or bleeding. NEUROLOGIC: No headache, seizures, numbness, tingling or weakness. PSYCHIATRIC: No depression, no loss of  interest in normal activity or change in sleep pattern.     Exam: chaperone present  BP 136/88 mmHg  Ht 5\' 3"  (1.6 m)  Wt 218 lb (98.884 kg)  BMI 38.63 kg/m2  Body mass index is 38.63 kg/(m^2).  General appearance : Well developed well nourished female. No acute distress HEENT: Neck supple, trachea midline, no carotid bruits, no thyroidmegaly Lungs: Clear to auscultation, no rhonchi or wheezes, or rib retractions  Heart: Regular rate and rhythm, no murmurs or gallops Breast:Examined in sitting and supine position were symmetrical in appearance, no palpable masses or tenderness,  no skin retraction, no nipple inversion, no nipple discharge, no skin discoloration, no axillary or supraclavicular lymphadenopathy Abdomen: no palpable masses or tenderness, no rebound or guarding Extremities: no edema or skin discoloration or tenderness  Pelvic:  Bartholin, Urethra, Skene Glands: Within normal limits             Vagina: No gross lesions or discharge, short  Cervix: Absent  Uterus  small barely palpable rudimentary horn  Adnexa  Without masses or tenderness  Anus and perineum  normal   Rectovaginal  normal sphincter tone without palpated masses or tenderness             Hemoccult not indicated     Assessment/Plan:  48 y.o. for annual exam with Rokitansky-Kuster-Hauser syndrome. Patient's PCP has been doing patient blood work so no blood work was done today. Pap smear will be due next year. She will follow with the reproductive endocrinologist next year and she wants to proceed with in vitro fertilization. We discussed importance of calcium vitamin D and regular exercise for osteoporosis prevention.   Ok EdwardsFERNANDEZ,Demarquez Ciolek H MD, 6:03 PM 06/18/2014

## 2014-06-19 LAB — URINALYSIS W MICROSCOPIC + REFLEX CULTURE
BACTERIA UA: NONE SEEN
Bilirubin Urine: NEGATIVE
CASTS: NONE SEEN
CRYSTALS: NONE SEEN
Glucose, UA: NEGATIVE mg/dL
Hgb urine dipstick: NEGATIVE
Ketones, ur: NEGATIVE mg/dL
Leukocytes, UA: NEGATIVE
NITRITE: NEGATIVE
PH: 6.5 (ref 5.0–8.0)
Protein, ur: NEGATIVE mg/dL
SPECIFIC GRAVITY, URINE: 1.005 (ref 1.005–1.030)
Squamous Epithelial / LPF: NONE SEEN
Urobilinogen, UA: 1 mg/dL (ref 0.0–1.0)

## 2014-06-19 LAB — COMPREHENSIVE METABOLIC PANEL
ALT: 26 U/L (ref 0–35)
AST: 21 U/L (ref 0–37)
Albumin: 4.1 g/dL (ref 3.5–5.2)
Alkaline Phosphatase: 111 U/L (ref 39–117)
BUN: 11 mg/dL (ref 6–23)
CALCIUM: 9.5 mg/dL (ref 8.4–10.5)
CHLORIDE: 104 meq/L (ref 96–112)
CO2: 25 meq/L (ref 19–32)
CREATININE: 0.91 mg/dL (ref 0.50–1.10)
Glucose, Bld: 84 mg/dL (ref 70–99)
Potassium: 3.8 mEq/L (ref 3.5–5.3)
Sodium: 137 mEq/L (ref 135–145)
Total Bilirubin: 0.9 mg/dL (ref 0.2–1.2)
Total Protein: 7.1 g/dL (ref 6.0–8.3)

## 2014-06-19 LAB — CBC WITH DIFFERENTIAL/PLATELET
BASOS ABS: 0.1 10*3/uL (ref 0.0–0.1)
Basophils Relative: 1 % (ref 0–1)
EOS PCT: 3 % (ref 0–5)
Eosinophils Absolute: 0.2 10*3/uL (ref 0.0–0.7)
HCT: 42.4 % (ref 36.0–46.0)
Hemoglobin: 14.7 g/dL (ref 12.0–15.0)
LYMPHS ABS: 1.6 10*3/uL (ref 0.7–4.0)
LYMPHS PCT: 20 % (ref 12–46)
MCH: 30.1 pg (ref 26.0–34.0)
MCHC: 34.7 g/dL (ref 30.0–36.0)
MCV: 86.7 fL (ref 78.0–100.0)
MPV: 9.5 fL (ref 9.4–12.4)
Monocytes Absolute: 0.9 10*3/uL (ref 0.1–1.0)
Monocytes Relative: 11 % (ref 3–12)
Neutro Abs: 5.2 10*3/uL (ref 1.7–7.7)
Neutrophils Relative %: 65 % (ref 43–77)
PLATELETS: 189 10*3/uL (ref 150–400)
RBC: 4.89 MIL/uL (ref 3.87–5.11)
RDW: 13.6 % (ref 11.5–15.5)
WBC: 8 10*3/uL (ref 4.0–10.5)

## 2014-06-19 LAB — CHOLESTEROL, TOTAL: Cholesterol: 133 mg/dL (ref 0–200)

## 2014-06-19 LAB — TSH: TSH: 2.951 u[IU]/mL (ref 0.350–4.500)

## 2014-08-24 ENCOUNTER — Telehealth: Payer: Self-pay | Admitting: Physician Assistant

## 2014-08-24 ENCOUNTER — Other Ambulatory Visit: Payer: Self-pay | Admitting: Physician Assistant

## 2014-08-24 MED ORDER — DILTIAZEM HCL ER COATED BEADS 240 MG PO CP24: 240.0000 mg | ORAL_CAPSULE | Freq: Every day | ORAL | Status: DC

## 2014-08-28 ENCOUNTER — Other Ambulatory Visit (HOSPITAL_COMMUNITY): Payer: Self-pay | Admitting: Cardiovascular Disease

## 2014-08-28 DIAGNOSIS — Z952 Presence of prosthetic heart valve: Secondary | ICD-10-CM

## 2014-08-28 DIAGNOSIS — I370 Nonrheumatic pulmonary valve stenosis: Secondary | ICD-10-CM

## 2014-08-28 DIAGNOSIS — I50810 Right heart failure, unspecified: Secondary | ICD-10-CM

## 2014-09-24 ENCOUNTER — Encounter (HOSPITAL_COMMUNITY): Payer: Self-pay

## 2014-10-17 ENCOUNTER — Encounter (HOSPITAL_COMMUNITY): Payer: Self-pay

## 2014-10-29 ENCOUNTER — Ambulatory Visit (HOSPITAL_COMMUNITY): Payer: 59 | Attending: Cardiovascular Disease

## 2014-10-29 DIAGNOSIS — Z952 Presence of prosthetic heart valve: Secondary | ICD-10-CM | POA: Diagnosis not present

## 2014-10-29 DIAGNOSIS — I50810 Right heart failure, unspecified: Secondary | ICD-10-CM

## 2014-10-29 DIAGNOSIS — I509 Heart failure, unspecified: Secondary | ICD-10-CM | POA: Diagnosis not present

## 2014-10-29 DIAGNOSIS — Q221 Congenital pulmonary valve stenosis: Secondary | ICD-10-CM | POA: Insufficient documentation

## 2014-10-29 DIAGNOSIS — I370 Nonrheumatic pulmonary valve stenosis: Secondary | ICD-10-CM

## 2015-03-13 DIAGNOSIS — Q231 Congenital insufficiency of aortic valve: Secondary | ICD-10-CM | POA: Insufficient documentation

## 2015-05-21 ENCOUNTER — Ambulatory Visit (HOSPITAL_COMMUNITY)
Admission: RE | Admit: 2015-05-21 | Discharge: 2015-05-21 | Disposition: A | Discharge status: Home / Self Care | Payer: 59 | Source: Ambulatory Visit | Attending: Vascular Surgery | Admitting: Vascular Surgery

## 2015-05-21 ENCOUNTER — Other Ambulatory Visit: Payer: Self-pay | Admitting: Cardiology

## 2015-05-21 DIAGNOSIS — I872 Venous insufficiency (chronic) (peripheral): Secondary | ICD-10-CM | POA: Insufficient documentation

## 2015-06-20 ENCOUNTER — Ambulatory Visit (INDEPENDENT_AMBULATORY_CARE_PROVIDER_SITE_OTHER): Payer: 59 | Admitting: Gynecology

## 2015-06-20 ENCOUNTER — Other Ambulatory Visit: Payer: Self-pay

## 2015-06-20 ENCOUNTER — Encounter: Payer: Self-pay | Admitting: Gynecology

## 2015-06-20 ENCOUNTER — Other Ambulatory Visit (HOSPITAL_COMMUNITY)
Admission: RE | Admit: 2015-06-20 | Discharge: 2015-06-20 | Disposition: A | Discharge status: Home / Self Care | Payer: 59 | Source: Ambulatory Visit | Attending: Gynecology | Admitting: Gynecology

## 2015-06-20 VITALS — BP 124/86 | Ht 63.0 in | Wt 219.0 lb

## 2015-06-20 DIAGNOSIS — N83202 Unspecified ovarian cyst, left side: Secondary | ICD-10-CM

## 2015-06-20 DIAGNOSIS — Z1151 Encounter for screening for human papillomavirus (HPV): Secondary | ICD-10-CM | POA: Diagnosis not present

## 2015-06-20 DIAGNOSIS — Q520 Congenital absence of vagina: Secondary | ICD-10-CM

## 2015-06-20 DIAGNOSIS — Z01411 Encounter for gynecological examination (general) (routine) with abnormal findings: Secondary | ICD-10-CM | POA: Insufficient documentation

## 2015-06-20 DIAGNOSIS — Z1231 Encounter for screening mammogram for malignant neoplasm of breast: Secondary | ICD-10-CM

## 2015-06-20 DIAGNOSIS — Z01419 Encounter for gynecological examination (general) (routine) without abnormal findings: Secondary | ICD-10-CM

## 2015-06-20 NOTE — Patient Instructions (Signed)
Urodynamic Testing  WHAT IS URODYNAMIC TESTING?  Urodynamic tests are done to determine how well your lower urinary tract is working. The lower urinary tract includes your bladder and the tube that empties your bladder (urethra).   When your kidneys filter your blood, urine is stored in your bladder until you feel the urge to pass urine (urinate). Urination requires coordination between the nerves and muscles of your bladder and urethra. When your lower urinary tract is working well, you should be able to:   · Start urinating when your bladder is full.  · Empty your bladder completely.  · Control the flow of your urine.  WHY DO I NEED URODYNAMIC TESTING?  You may need urodynamic testing if you:  · Are leaking urine (incontinence).  · Have problems starting or stopping your urine flow.  · Have frequent or painful urination.  · Have frequent urinary tract infections.  · Cannot empty your bladder completely.  · Have strong urges to pass urine (urgency).  · Have a weak flow of urine.  HOW IS URODYNAMIC TESTING DONE?  Urodynamic tests may be done separately or all during one testing visit. These tests may be done at your health care provider's office, a clinic, or a hospital. You may be given an antibiotic medicine before or after testing to prevent infection. Ask your health care provider if you should:  · Stop taking any of your regular medicines.  · Arrive for the test with a full bladder.  The urodynamic tests you may have done include:  Uroflowmetry  This test measures how much urine you pass and how long it takes to pass.  · You sit on a special toilet to urinate.  · The toilet measures the volume and the time of your urine flow.  · These measurements are sent to a computer that creates a graph of your urine flow.  Postvoid residual measurement  This test measures how much urine is left in your bladder after you urinate.  · It uses sound waves (ultrasound) to create an image of your bladder.  · The test can also be  done by inserting a thin, flexible tube (catheter) into your bladder after you urinate.  · Remaining urine is measured in milliliters (mL). If you have more than 100 mL left in your bladder after you urinate, your bladder is not emptying as it should.  Cystometric testing  This test uses a special bladder catheter that can measure pressure.   · A numbing medicine (local anesthetic) may be used.  · First, a normal catheter is used to empty your bladder completely.  · Then the measuring catheter is placed, and your bladder is filled with warm, germ-free (sterile) water.  · Pressure measurements will be taken:  ¨ As your bladder fills.  ¨ When you feel the need to urinate.  ¨ As your bladder is emptied.  · You may be asked to cough or bear down to check for leakage.  · In some cases, your bladder may be filled with a material that shows up on X-rays (contrast material) so that X-ray pictures can be taken during the test.  Electromyography  This test measures the electrical activity of the nerves and muscles of your bladder and the opening of your urethra.   · It tells how well your nerves are communicating with your muscles.  · Sticky patches are placed near your rectum and urethra to measure electrical activity.  WHAT ARE THE RISKS OF THIS TESTING?    Generally, these tests are safe. However, problems can occur and include:  · Discomfort.  · Frequent urge to urinate.  · Bleeding.  · Infection.  · An allergic reaction to contrast material, if contrast material is used.  WHAT HAPPENS AFTER THE TESTING?   · You should be able to go home right away and do your usual activities.  · You may be instructed to drink a tall glass of water every 30 minutes for the first 2 hours you are home.  · Taking a warm bath or using a warm compress may relieve any discomfort near your urethra.  Let your health care provider know if you have:  · Pain.  · Blood in your urine.  · Chills.  · Fever.  WHAT DO MY TEST RESULTS MEAN?  Discuss the  results of your urodynamic tests with your health care provider. Your health care provider will use the results of these and other tests, along with your signs and symptoms, to make a diagnosis. Some common causes for abnormal results from urodynamic tests include:  · Enlarged prostate in men.  · Overactive bladder.  · Urinary tract infection.  · Nervous system diseases.  · Spinal cord damage.     This information is not intended to replace advice given to you by your health care provider. Make sure you discuss any questions you have with your health care provider.     Document Released: 04/18/2007 Document Revised: 07/12/2014 Document Reviewed: 10/01/2013  Elsevier Interactive Patient Education ©2016 [employer] Inc[employer]

## 2015-06-20 NOTE — Progress Notes (Signed)
Maria FellingMargaret D Wolfe June 01, 1966 [id number]   History:    49 y.o.  for annual gyn exam  Who is complaining of urinary incontinence. She states that even sitting down if she sneezes she leaks urine. She also leaks urine when she does any form of straining. She does have nocturia but she is on a diuretic for hypertension.  Patient has Rokitansky-Kuster-Hauser syndrome (mllerian dysgenesis). Patient several years ago has had 2 laparoscopic procedures both describing bilateral normal-appearing fallopian tubes and "rudimentary uterine horn bilaterally". There was no report of any endometriosis. Patient has had a functional vagina with the use of dilators and has had no problem with sexual intercourse. Patient had been [facility] department of reproductive endocrinology and infertility. He had commented in one of his consultation note that pregnancy was not going to be possible because she did not have a functional endometrium despite normal ovarian function. She had been interested in the past for surrogacy. Patient was reported to have had an abnormal AMH in April 2013. She is also being followed by the cardiologist whereby she has had prior history of pulmonary valve replacement.  In 2001 she had a Pap smear showing low grade vaginal dysplasia without high-risk HPV detected. We did not initially colposcope her but repeated her Pap and she's had 12 normal Pap smears. Her last Pap was 2012. At some point a CT scan was done for increased echogenicity in the left mid kidney. A small adrenal adenoma on the left adrenal gland was seen. This was April, 2006. Followup limited CT scan in May, 2007 showed a small stable left adrenal adenoma with Dr. Luvenia StarchMaxwell's opinion that no further evaluation was recommended.  Patient had an ultrasound the office 06/22/2014 the following findings: Under developed uterus homogeneous myometrium.Then echogenic endometrium. Right ovary with  a small 8 mm follicle was noted along with a thin-walled cyst measuring 26 x 25 x 21 mm with internal low level echoes with positive color flow to the wall of the cyst. Possible corpus luteum cyst. Left ovary 2 follicles one measuring 20 x 15 mm the second measuring 10 x 10 mm. There was no fluid in the cul-de-sac   in 2014 patient had a normal FSH and AMH and was referred for in vitro fertilization with the utilization of surrogate and sperm donor with Dr.Dr. Minda Meoammer Yalcinkaya reproductive endocrinologist. She stated she saw him earlier this year and had one stimulated cycle but never completed the procedure because lack of funds. Patient will be scheduling her next year.    in 2014 patient had   Past medical history,surgical history, family history and social history were all reviewed and documented in the EPIC chart.  Gynecologic History No LMP recorded. Patient is not currently having periods (Reason: Other). Contraception: none Last Pap: 2013. Results were: normal Last mammogram:  2015. Results were: normal  Obstetric History OB History  Gravida Para Term Preterm AB SAB TAB Ectopic Multiple Living  0                  ROS: A ROS was performed and pertinent positives and negatives are included in the history.  GENERAL: No fevers or chills. HEENT: No change in vision, no earache, sore throat or sinus congestion. NECK: No pain or stiffness. CARDIOVASCULAR: No chest pain or pressure. No palpitations. PULMONARY: No shortness of breath, cough or wheeze. GASTROINTESTINAL: No abdominal pain, nausea, vomiting or diarrhea, melena or bright red blood per rectum.  GENITOURINARY: No urinary frequency, urgency, hesitancy or dysuria. MUSCULOSKELETAL: No joint or muscle pain, no back pain, no recent trauma. DERMATOLOGIC: No rash, no itching, no lesions. ENDOCRINE: No polyuria, polydipsia, no heat or cold intolerance. No recent change in weight. HEMATOLOGICAL: No anemia or easy bruising or bleeding.  NEUROLOGIC: No headache, seizures, numbness, tingling or weakness. PSYCHIATRIC: No depression, no loss of interest in normal activity or change in sleep pattern.     Exam: chaperone present  BP 124/86 mmHg  Ht  (1.6 m)  Wt 219 lb (99.338 kg)  BMI 38.80 kg/m2  Body mass index is 38.8 kg/(m^2).  General appearance : Well developed well nourished female. No acute distress HEENT: Eyes: no retinal hemorrhage or exudates,  Neck supple, trachea midline, no carotid bruits, no thyroidmegaly Lungs: Clear to auscultation, no rhonchi or wheezes, or rib retractions  Heart: Regular rate and rhythm, no murmurs or gallops Breast:Examined in sitting and supine position were symmetrical in appearance, no palpable masses or tenderness,  no skin retraction, no nipple inversion, no nipple discharge, no skin discoloration, no axillary or supraclavicular lymphadenopathy Abdomen: no palpable masses or tenderness, no rebound or guarding Extremities: no edema or skin discoloration or tenderness  Pelvic:  Bartholin, Urethra, Skene Glands: Within normal limits             Vagina: No gross lesions or discharge , small short  Cervix:  absent  Uterus   Small barely palpable  Adnexa  Without masses or tenderness  Anus and perineum  normal   Rectovaginal  normal sphincter tone without palpated masses or tenderness             Hemoccult  Not indicated     Assessment/Plan:  49 y.o. female for annual exam  With Rokitansky-Kuster-Hauser syndrome.  Patient with history of left ovarian cyst will return back to the office in 1-2 weeks for a pelvic ultrasound at which time she will come in a fasting state for the following screening blood were: comprehensive metabolic panel, fasting lipid profile, TSH, CBC, and urinalysis. Patient will be referred to my urology colleagues for urodynamic testing and further evaluation of her stress urinary incontinence. Pap smear was done today. Patient had her flu vaccine in October  this year.   Ok Edwards MD, 3:30 PM 06/20/2015

## 2015-06-23 ENCOUNTER — Telehealth: Payer: Self-pay | Admitting: *Deleted

## 2015-06-23 NOTE — Telephone Encounter (Signed)
Office notes faxed to [facility] they will fax me back with time and date to relay to patient.

## 2015-06-23 NOTE — Telephone Encounter (Signed)
-----   Message from Ok EdwardsJuan H Fernandez, MD sent at 06/20/2015  3:36 PM EST -----  Victorino DikeJennifer, please make an appointment for this patient in January with the urologist for her stress urinary incontinence. Patient will urodynamic evaluation as well. Patient with a history of Rokitansky-Kuster-Hauser syndrome.

## 2015-06-24 LAB — CYTOLOGY - PAP

## 2015-06-25 ENCOUNTER — Ambulatory Visit
Admission: RE | Admit: 2015-06-25 | Discharge: 2015-06-25 | Disposition: A | Discharge status: Home / Self Care | Payer: 59 | Source: Ambulatory Visit

## 2015-06-25 DIAGNOSIS — Z1231 Encounter for screening mammogram for malignant neoplasm of breast: Secondary | ICD-10-CM

## 2015-06-26 NOTE — Telephone Encounter (Signed)
error 

## 2015-07-03 ENCOUNTER — Telehealth: Payer: Self-pay

## 2015-07-03 NOTE — Telephone Encounter (Signed)
Patient called because I had contacted her yesterday regarding her pap result (ASCUS w Neg HPV) and recommended repap in one year. She said she had been reading on the internet and she had some questions and needed reassurance. Questions answered and patient reassured nothing to worry about but follow up always important.

## 2015-07-17 NOTE — Telephone Encounter (Signed)
Appointment 08/07/15 @ 8:30am with Dr.MacDiarmid

## 2015-07-30 ENCOUNTER — Ambulatory Visit (INDEPENDENT_AMBULATORY_CARE_PROVIDER_SITE_OTHER): Payer: 59 | Admitting: Gynecology

## 2015-07-30 ENCOUNTER — Ambulatory Visit (INDEPENDENT_AMBULATORY_CARE_PROVIDER_SITE_OTHER): Payer: 59

## 2015-07-30 ENCOUNTER — Other Ambulatory Visit: Payer: Self-pay | Admitting: Gynecology

## 2015-07-30 ENCOUNTER — Encounter: Payer: Self-pay | Admitting: Gynecology

## 2015-07-30 VITALS — BP 128/82

## 2015-07-30 DIAGNOSIS — Q519 Congenital malformation of uterus and cervix, unspecified: Secondary | ICD-10-CM

## 2015-07-30 DIAGNOSIS — Z01419 Encounter for gynecological examination (general) (routine) without abnormal findings: Secondary | ICD-10-CM

## 2015-07-30 DIAGNOSIS — Z8742 Personal history of other diseases of the female genital tract: Secondary | ICD-10-CM

## 2015-07-30 DIAGNOSIS — IMO0002 Reserved for concepts with insufficient information to code with codable children: Secondary | ICD-10-CM

## 2015-07-30 DIAGNOSIS — N83202 Unspecified ovarian cyst, left side: Secondary | ICD-10-CM

## 2015-07-30 DIAGNOSIS — R896 Abnormal cytological findings in specimens from other organs, systems and tissues: Secondary | ICD-10-CM | POA: Diagnosis not present

## 2015-07-30 DIAGNOSIS — Q520 Congenital absence of vagina: Secondary | ICD-10-CM

## 2015-07-30 LAB — CBC WITH DIFFERENTIAL/PLATELET
BASOS PCT: 1 % (ref 0–1)
Basophils Absolute: 0.1 10*3/uL (ref 0.0–0.1)
EOS ABS: 0.2 10*3/uL (ref 0.0–0.7)
EOS PCT: 2 % (ref 0–5)
HCT: 44.2 % (ref 36.0–46.0)
HEMOGLOBIN: 15 g/dL (ref 12.0–15.0)
Lymphocytes Relative: 14 % (ref 12–46)
Lymphs Abs: 1.2 10*3/uL (ref 0.7–4.0)
MCH: 29.8 pg (ref 26.0–34.0)
MCHC: 33.9 g/dL (ref 30.0–36.0)
MCV: 87.9 fL (ref 78.0–100.0)
MONO ABS: 0.7 10*3/uL (ref 0.1–1.0)
MONOS PCT: 8 % (ref 3–12)
MPV: 9.3 fL (ref 8.6–12.4)
Neutro Abs: 6.6 10*3/uL (ref 1.7–7.7)
Neutrophils Relative %: 75 % (ref 43–77)
PLATELETS: 201 10*3/uL (ref 150–400)
RBC: 5.03 MIL/uL (ref 3.87–5.11)
RDW: 13.4 % (ref 11.5–15.5)
WBC: 8.8 10*3/uL (ref 4.0–10.5)

## 2015-07-30 LAB — COMPREHENSIVE METABOLIC PANEL
ALBUMIN: 3.8 g/dL (ref 3.6–5.1)
ALT: 26 U/L (ref 6–29)
AST: 23 U/L (ref 10–35)
Alkaline Phosphatase: 110 U/L (ref 33–115)
BUN: 9 mg/dL (ref 7–25)
CHLORIDE: 101 mmol/L (ref 98–110)
CO2: 25 mmol/L (ref 20–31)
CREATININE: 0.81 mg/dL (ref 0.50–1.10)
Calcium: 8.9 mg/dL (ref 8.6–10.2)
GLUCOSE: 88 mg/dL (ref 65–99)
Potassium: 3.9 mmol/L (ref 3.5–5.3)
SODIUM: 136 mmol/L (ref 135–146)
Total Bilirubin: 0.9 mg/dL (ref 0.2–1.2)
Total Protein: 6.9 g/dL (ref 6.1–8.1)

## 2015-07-30 LAB — LIPID PANEL
Cholesterol: 132 mg/dL (ref 125–200)
HDL: 32 mg/dL — ABNORMAL LOW (ref >=46)
LDL CALC: 83 mg/dL (ref <130)
TRIGLYCERIDES: 87 mg/dL (ref <150)
Total CHOL/HDL Ratio: 4.1 Ratio (ref <=5.0)
VLDL: 17 mg/dL (ref <30)

## 2015-07-30 LAB — TSH: TSH: 3.014 u[IU]/mL (ref 0.350–4.500)

## 2015-07-30 NOTE — Progress Notes (Signed)
   Patient is a 50 year old who presented to the office for follow-up ultrasound as a result of previously seen left ovarian cyst. Patient with history of Rokitansky-Kuster-Hauser syndrome (mllerian dysgenesis). Patient several years ago has had 2 laparoscopic procedures both describing bilateral normal-appearing fallopian tubes and "rudimentary uterine horn bilaterally". There was no report of any endometriosis. Patient has had a functional vagina with the use of dilators and has had no problem with sexual intercourse. Patient had been followed by Dr. Karlene Lineman at [facility] department of reproductive endocrinology and infertility. He had commented in one of his consultation note that pregnancy was not going to be possible because she did not have a functional endometrium despite normal ovarian function. She had been interested in the past for surrogacy. Patient was reported to have had an abnormal AMH in April 2013.   On eye last office visit for her annual exam in December 2016 she was complaining of urinary incontinence and was referred to the urologist for which she has a consultation month. She will also follow with the reproductive endocrinologist later this year. Her Pap smear recently demonstrated ASCUS but negative HPV.  Ultrasound December 2015 demonstrated the following: Under developed uterus homogeneous myometrium.Then echogenic endometrium. Right ovary with a small 8 mm follicle was noted along with a thin-walled cyst measuring 26 x 25 x 21 mm with internal low level echoes with positive color flow to the wall of the cyst. Possible corpus luteum cyst. Left ovary 2 follicles one measuring 20 x 15 mm the second measuring 10 x 10 mm. There was no fluid in the cul-de-sac  Ultrasound in January 2017 demonstrated the following: Uterus measured 4.7 x 2.8 x 1.9 cm with endometrial stripe of 3.9 mm anteverted uterus homogeneous right ovary normal previous has not seen a small left  ovarian follicle measured 24 x 26 x 19 mm was seen negative color flow  Assessment/plan: 50 year old patient with Rokitansky-Kuster-Hauser syndrome with past history of left ovarian cyst completely resolved. Small right ovarian follicle noted. Patient with follow-up appointment next month with urologist for stress urinary incontinence. Later this year she'll follow-up with the reproductive endocrinologist. Patient with Pap smear ASCUS negative HPV per new guidelines will be to repeat Pap smear with HPV screening which she will return back in December this year.

## 2015-07-31 LAB — URINALYSIS W MICROSCOPIC + REFLEX CULTURE
Bacteria, UA: NONE SEEN [HPF]
Bilirubin Urine: NEGATIVE
CASTS: NONE SEEN [LPF]
Crystals: NONE SEEN [HPF]
GLUCOSE, UA: NEGATIVE
HGB URINE DIPSTICK: NEGATIVE
Ketones, ur: NEGATIVE
LEUKOCYTES UA: NEGATIVE
NITRITE: NEGATIVE
PH: 6 (ref 5.0–8.0)
Protein, ur: NEGATIVE
RBC / HPF: NONE SEEN RBC/HPF (ref <=2)
SPECIFIC GRAVITY, URINE: 1.017 (ref 1.001–1.035)
Squamous Epithelial / LPF: NONE SEEN [HPF] (ref <=5)
WBC, UA: NONE SEEN WBC/HPF (ref <=5)
YEAST: NONE SEEN [HPF]

## 2015-08-29 ENCOUNTER — Other Ambulatory Visit: Payer: Self-pay | Admitting: Physician Assistant

## 2015-08-29 NOTE — Telephone Encounter (Signed)
Please advise as patient has never been seen in the office and requested medication was ordered by Luberta Robertson. Thanks, MI

## 2015-08-29 NOTE — Telephone Encounter (Signed)
I see some notes from Dr. Donnie Aho in her chart.  I think you should call her and ask who her cardiologist is.  Dr. Elease Hashimoto is supervising MD for Carlean Jews, PA that is why his name showed up on the chart but he has never seen her.

## 2015-08-29 NOTE — Telephone Encounter (Signed)
Spoke with patient and she stated that she sees Dr Donnie Aho and he has been refilling this for her. She is aware that our office will deny this request and that she should contact the pharmacy and give them the information of where to send the request or call Dr Tawana Scale office.

## 2015-08-31 ENCOUNTER — Other Ambulatory Visit: Payer: Self-pay | Admitting: Student

## 2015-08-31 MED ORDER — DILTIAZEM HCL ER COATED BEADS 240 MG PO CP24: 240.0000 mg | ORAL_CAPSULE | Freq: Every day | ORAL | Status: DC

## 2015-09-15 ENCOUNTER — Encounter (HOSPITAL_BASED_OUTPATIENT_CLINIC_OR_DEPARTMENT_OTHER): Payer: Self-pay | Admitting: *Deleted

## 2015-09-15 ENCOUNTER — Emergency Department (HOSPITAL_BASED_OUTPATIENT_CLINIC_OR_DEPARTMENT_OTHER): Payer: 59

## 2015-09-15 ENCOUNTER — Emergency Department (HOSPITAL_BASED_OUTPATIENT_CLINIC_OR_DEPARTMENT_OTHER)
Admission: EM | Admit: 2015-09-15 | Discharge: 2015-09-15 | Disposition: A | Discharge status: Home / Self Care | Payer: 59 | Attending: Emergency Medicine | Admitting: Emergency Medicine

## 2015-09-15 DIAGNOSIS — R109 Unspecified abdominal pain: Secondary | ICD-10-CM | POA: Diagnosis present

## 2015-09-15 DIAGNOSIS — Z8739 Personal history of other diseases of the musculoskeletal system and connective tissue: Secondary | ICD-10-CM | POA: Insufficient documentation

## 2015-09-15 DIAGNOSIS — Z7982 Long term (current) use of aspirin: Secondary | ICD-10-CM | POA: Diagnosis not present

## 2015-09-15 DIAGNOSIS — Z880 Allergy status to penicillin: Secondary | ICD-10-CM | POA: Insufficient documentation

## 2015-09-15 DIAGNOSIS — I10 Essential (primary) hypertension: Secondary | ICD-10-CM | POA: Insufficient documentation

## 2015-09-15 DIAGNOSIS — Z791 Long term (current) use of non-steroidal anti-inflammatories (NSAID): Secondary | ICD-10-CM | POA: Insufficient documentation

## 2015-09-15 DIAGNOSIS — Z8742 Personal history of other diseases of the female genital tract: Secondary | ICD-10-CM | POA: Insufficient documentation

## 2015-09-15 DIAGNOSIS — Z9889 Other specified postprocedural states: Secondary | ICD-10-CM | POA: Diagnosis not present

## 2015-09-15 DIAGNOSIS — Z3202 Encounter for pregnancy test, result negative: Secondary | ICD-10-CM | POA: Insufficient documentation

## 2015-09-15 DIAGNOSIS — Q519 Congenital malformation of uterus and cervix, unspecified: Secondary | ICD-10-CM | POA: Diagnosis not present

## 2015-09-15 DIAGNOSIS — K859 Acute pancreatitis without necrosis or infection, unspecified: Secondary | ICD-10-CM | POA: Insufficient documentation

## 2015-09-15 DIAGNOSIS — Z79899 Other long term (current) drug therapy: Secondary | ICD-10-CM | POA: Insufficient documentation

## 2015-09-15 LAB — COMPREHENSIVE METABOLIC PANEL
ALT: 22 U/L (ref 14–54)
AST: 17 U/L (ref 15–41)
Albumin: 4 g/dL (ref 3.5–5.0)
Alkaline Phosphatase: 128 U/L — ABNORMAL HIGH (ref 38–126)
Anion gap: 9 (ref 5–15)
BUN: 14 mg/dL (ref 6–20)
CO2: 25 mmol/L (ref 22–32)
Calcium: 8.8 mg/dL — ABNORMAL LOW (ref 8.9–10.3)
Chloride: 103 mmol/L (ref 101–111)
Creatinine, Ser: 0.68 mg/dL (ref 0.44–1.00)
GFR calc Af Amer: >60 mL/min (ref >60)
GFR calc non Af Amer: >60 mL/min (ref >60)
Glucose, Bld: 108 mg/dL — ABNORMAL HIGH (ref 65–99)
Potassium: 3.9 mmol/L (ref 3.5–5.1)
Sodium: 137 mmol/L (ref 135–145)
Total Bilirubin: 1.2 mg/dL (ref 0.3–1.2)
Total Protein: 7.4 g/dL (ref 6.5–8.1)

## 2015-09-15 LAB — CBC WITH DIFFERENTIAL/PLATELET
Basophils Absolute: 0 10*3/uL (ref 0.0–0.1)
Basophils Relative: 0 %
Eosinophils Absolute: 0.2 10*3/uL (ref 0.0–0.7)
Eosinophils Relative: 2 %
HCT: 46 % (ref 36.0–46.0)
Hemoglobin: 15.9 g/dL — ABNORMAL HIGH (ref 12.0–15.0)
Lymphocytes Relative: 15 %
Lymphs Abs: 2 10*3/uL (ref 0.7–4.0)
MCH: 29.8 pg (ref 26.0–34.0)
MCHC: 34.6 g/dL (ref 30.0–36.0)
MCV: 86.3 fL (ref 78.0–100.0)
Monocytes Absolute: 1 10*3/uL (ref 0.1–1.0)
Monocytes Relative: 7 %
Neutro Abs: 10.2 10*3/uL — ABNORMAL HIGH (ref 1.7–7.7)
Neutrophils Relative %: 76 %
Platelets: 292 10*3/uL (ref 150–400)
RBC: 5.33 MIL/uL — ABNORMAL HIGH (ref 3.87–5.11)
RDW: 13.4 % (ref 11.5–15.5)
WBC: 13.4 10*3/uL — ABNORMAL HIGH (ref 4.0–10.5)

## 2015-09-15 LAB — URINALYSIS, ROUTINE W REFLEX MICROSCOPIC
BILIRUBIN URINE: NEGATIVE
GLUCOSE, UA: NEGATIVE mg/dL
HGB URINE DIPSTICK: NEGATIVE
KETONES UR: NEGATIVE mg/dL
Leukocytes, UA: NEGATIVE
Nitrite: NEGATIVE
PH: 6.5 (ref 5.0–8.0)
PROTEIN: NEGATIVE mg/dL
Specific Gravity, Urine: 1.01 (ref 1.005–1.030)

## 2015-09-15 LAB — PREGNANCY, URINE: PREG TEST UR: NEGATIVE

## 2015-09-15 LAB — LIPASE, BLOOD: Lipase: 119 U/L — ABNORMAL HIGH (ref 11–51)

## 2015-09-15 MED ORDER — SODIUM CHLORIDE 0.9 % IV BOLUS (SEPSIS)
1000.0000 mL | Freq: Once | INTRAVENOUS | Status: AC
  Administered 2015-09-15: 1000 mL via INTRAVENOUS

## 2015-09-15 MED ORDER — PERCOCET 5-325 MG PO TABS: 1.0000 | ORAL_TABLET | Freq: Four times a day (QID) | ORAL | Status: DC | PRN

## 2015-09-15 MED ORDER — PROMETHAZINE HCL 25 MG PO TABS: 25.0000 mg | ORAL_TABLET | Freq: Three times a day (TID) | ORAL | Status: DC | PRN

## 2015-09-15 MED ORDER — IOHEXOL 350 MG/ML SOLN
85.0000 mL | Freq: Once | INTRAVENOUS | Status: AC | PRN
  Administered 2015-09-15: 85 mL via INTRAVENOUS

## 2015-09-15 MED ORDER — FENTANYL CITRATE (PF) 100 MCG/2ML IJ SOLN
100.0000 ug | Freq: Once | INTRAMUSCULAR | Status: AC
  Administered 2015-09-15: 100 ug via INTRAVENOUS
  Filled 2015-09-15: qty 2

## 2015-09-15 MED ORDER — ONDANSETRON HCL 4 MG/2ML IJ SOLN
4.0000 mg | Freq: Once | INTRAMUSCULAR | Status: AC
  Administered 2015-09-15: 4 mg via INTRAVENOUS
  Filled 2015-09-15: qty 2

## 2015-09-15 NOTE — ED Provider Notes (Signed)
CSN: [id number]     Arrival date & time 09/15/15  1606 History   First MD Initiated Contact with Patient 09/15/15 1857     Chief Complaint  Patient presents with  . Abdominal Pain     (Consider location/radiation/quality/duration/timing/severity/associated sxs/prior Treatment) HPI Patient presents to the emergency department with mid abdominal pain that started last night.  The patient states that nothing seems to make her condition better or worse.  The patient states that palpation does irritate her abdomen.  The patient states she did not take any medications prior to arrival.  Patient states she does not have any chest pain, shortness breath, nausea, vomiting, diarrhea, weakness, dizziness, headache, blurred vision, neck pain, dysuria, incontinence, bloody stool, hematemesis, rash, edema, near syncope or syncope.  The patient states that she does have pain that radiates to her back Past Medical History  Diagnosis Date  . Rudimentary uterus     Agenesis of vagina  . Ovarian cyst     Left  . Hypertension   . Aortic stenosis   . Bursitis of hip     right   Past Surgical History  Procedure Laterality Date  . Wrist surgery    . Cardiac surgery  2002    Valve replacement  . Pelvic laparoscopy  91,97     Diag. Lap X 2-Ovarian cyst  . Ross konno procedure    . Cardiac catheterization    . Cardiac valve surgery     Family History  Problem Relation Age of Onset  . Diabetes Father   . Hypertension Father   . Heart disease Father   . Hypertension Sister   . Breast cancer Maternal Grandmother     Age 33's  . Breast cancer Paternal Grandmother     Age 10's   Social History  Substance Use Topics  . Smoking status: Never Smoker   . Smokeless tobacco: None  . Alcohol Use: No   OB History    Gravida Para Term Preterm AB TAB SAB Ectopic Multiple Living   0              Review of Systems All other systems negative except as documented in the HPI. All pertinent positives and  negatives as reviewed in the HPI.   Allergies  Amoxicillin and Penicillins  Home Medications   Prior to Admission medications   Medication Sig Start Date End Date Taking? Authorizing Provider  lisdexamfetamine (VYVANSE) 40 MG capsule Take 40 mg by mouth every morning.   Yes Historical Provider, MD  aspirin 81 MG tablet Take 81 mg by mouth at bedtime.     Historical Provider, MD  furosemide (LASIX) 20 MG tablet Take 20 mg by mouth daily.      Historical Provider, MD  naproxen sodium (ANAPROX) 220 MG tablet Take 220-440 mg by mouth 2 (two) times daily with a meal. Reported on 06/20/2015    Historical Provider, MD  potassium chloride 20 MEQ/15ML (10%) solution Take 20 mEq by mouth at bedtime.      Historical Provider, MD  spironolactone (ALDACTONE) 25 MG tablet Take 25 mg by mouth daily.      Historical Provider, MD   BP 130/65 mmHg  Pulse 67  Temp(Src) 97.5 F (36.4 C) (Oral)  Resp 18  Wt 99.791 kg  SpO2 99% Physical Exam  Constitutional: She is oriented to person, place, and time. She appears well-developed and well-nourished. No distress.  HENT:  Head: Normocephalic and atraumatic.  Mouth/Throat: Oropharynx is clear and  moist.  Eyes: Pupils are equal, round, and reactive to light.  Neck: Normal range of motion. Neck supple.  Cardiovascular: Normal rate, regular rhythm and normal heart sounds.  Exam reveals no gallop and no friction rub.   No murmur heard. Pulmonary/Chest: Effort normal and breath sounds normal. No respiratory distress. She has no wheezes.  Abdominal: Soft. Bowel sounds are normal. She exhibits no distension. There is tenderness. There is no rebound and no guarding.  Neurological: She is alert and oriented to person, place, and time. She exhibits normal muscle tone. Coordination normal.  Skin: Skin is warm and dry. No rash noted. No erythema.  Psychiatric: She has a normal mood and affect. Her behavior is normal.  Nursing note and vitals reviewed.   ED Course   Procedures (including critical care time) Labs Review Labs Reviewed  URINALYSIS, ROUTINE W REFLEX MICROSCOPIC (NOT AT [facility]) - Abnormal; Notable for the following:    APPearance CLOUDY (*)    All other components within normal limits  COMPREHENSIVE METABOLIC PANEL - Abnormal; Notable for the following:    Glucose, Bld 108 (*)    Calcium 8.8 (*)    Alkaline Phosphatase 128 (*)    All other components within normal limits  LIPASE, BLOOD - Abnormal; Notable for the following:    Lipase 119 (*)    All other components within normal limits  CBC WITH DIFFERENTIAL/PLATELET - Abnormal; Notable for the following:    WBC 13.4 (*)    RBC 5.33 (*)    Hemoglobin 15.9 (*)    Neutro Abs 10.2 (*)    All other components within normal limits  PREGNANCY, URINE    Imaging Review Ct Abdomen Pelvis W Contrast  09/15/2015  CLINICAL DATA:  Acute onset upper abdominal pain and leukocytosis. Initial encounter. EXAM: CT ABDOMEN AND PELVIS WITH CONTRAST TECHNIQUE: Multidetector CT imaging of the abdomen and pelvis was performed using the standard protocol following bolus administration of intravenous contrast. CONTRAST:  85mL OMNIPAQUE IOHEXOL 350 MG/ML SOLN COMPARISON:  CT of the abdomen and pelvis performed 11/09/2005, and MRI of the lumbar spine performed 12/25/2007 FINDINGS: The visualized lung bases are clear. Scattered vague heterogeneity is noted within the liver, possibly reflecting some degree of venous congestion. There is unusual lobulated extension of the caudate lobe to the left side. No definite dominant mass is characterized. The spleen is unremarkable in appearance. The gallbladder is within normal limits. The pancreas and right adrenal gland are unremarkable. A small 1.0 cm left adrenal nodule is stable in appearance, reflecting an adrenal adenoma. The kidneys are unremarkable in appearance. There is no evidence of hydronephrosis. No renal or ureteral stones are seen. No perinephric stranding is  appreciated. No free fluid is identified. The small bowel is unremarkable in appearance. The stomach is within normal limits. No acute vascular abnormalities are seen. There is mild stranding tracking along the mesentery, inferiorly from the distal pancreatic body and tail. The appendix is not definitely characterized; there is no evidence of appendicitis. The colon is grossly unremarkable in appearance. The bladder is decompressed and not well assessed. The patient is status post hysterectomy. No suspicious adnexal masses are seen. The ovaries are grossly symmetric. No inguinal lymphadenopathy is seen. No acute osseous abnormalities are identified. IMPRESSION: 1. Mild stranding tracking along the mesentery, inferiorly from the distal pancreatic body and tail. Given the patient's elevated lipase, this is concerning for very mild pancreatitis. The pancreas itself remains unremarkable in appearance. 2. Scattered vague heterogeneity within the liver,  possibly reflecting some degree of venous congestion. No dominant mass seen. 3. Small left adrenal adenoma again noted. Electronically Signed   By: Roanna RaiderJeffery  Chang M.D.   On: 09/15/2015 22:08   I have personally reviewed and evaluated these images and lab results as part of my medical decision-making.   Patient is feeling no pain at this time.  She given IV fluids.  Patient is advised to the plan and all questions were answered.  She will be treated for pancreatitis.  She also be given follow-up with GI.  Patient is also advised follow-up with her primary care doctor    Charlestine NightChristopher Eli Adami, PA-C 09/15/15 2350  Rolan BuccoMelanie Belfi, MD 09/16/15 1747

## 2015-09-15 NOTE — Discharge Instructions (Signed)
Return here as needed.  Follow-up with the GI Dr. provided.  Also follow-up with your primary care doctor.  Clear liquid diet over the next 24-48 hours    Acute Pancreatitis Acute pancreatitis is a disease in which the pancreas becomes suddenly inflamed. The pancreas is a large gland located behind your stomach. The pancreas produces enzymes that help digest food. The pancreas also releases the hormones glucagon and insulin that help regulate blood sugar. Damage to the pancreas occurs when the digestive enzymes from the pancreas are activated and begin attacking the pancreas before being released into the intestine. Most acute attacks last a couple of days and can cause serious complications. Some people become dehydrated and develop low blood pressure. In severe cases, bleeding into the pancreas can lead to shock and can be life-threatening. The lungs, heart, and kidneys may fail. CAUSES  Pancreatitis can happen to anyone. In some cases, the cause is unknown. Most cases are caused by:  Alcohol abuse.  Gallstones. Other less common causes are:  Certain medicines.  Exposure to certain chemicals.  Infection.  Damage caused by an accident (trauma).  Abdominal surgery. SYMPTOMS   Pain in the upper abdomen that may radiate to the back.  Tenderness and swelling of the abdomen.  Nausea and vomiting. DIAGNOSIS  Your caregiver will perform a physical exam. Blood and stool tests may be done to confirm the diagnosis. Imaging tests may also be done, such as X-rays, CT scans, or an ultrasound of the abdomen. TREATMENT  Treatment usually requires a stay in the hospital. Treatment may include:  Pain medicine.  Fluid replacement through an intravenous line (IV).  Placing a tube in the stomach to remove stomach contents and control vomiting.  Not eating for 3 or 4 days. This gives your pancreas a rest, because enzymes are not being produced that can cause further damage.  Antibiotic  medicines if your condition is caused by an infection.  Surgery of the pancreas or gallbladder. HOME CARE INSTRUCTIONS   Follow the diet advised by your caregiver. This may involve avoiding alcohol and decreasing the amount of fat in your diet.  Eat smaller, more frequent meals. This reduces the amount of digestive juices the pancreas produces.  Drink enough fluids to keep your urine clear or pale yellow.  Only take over-the-counter or prescription medicines as directed by your caregiver.  Avoid drinking alcohol if it caused your condition.  Do not smoke.  Get plenty of rest.  Check your blood sugar at home as directed by your caregiver.  Keep all follow-up appointments as directed by your caregiver. SEEK MEDICAL CARE IF:   You do not recover as quickly as expected.  You develop new or worsening symptoms.  You have persistent pain, weakness, or nausea.  You recover and then have another episode of pain. SEEK IMMEDIATE MEDICAL CARE IF:   You are unable to eat or keep fluids down.  Your pain becomes severe.  You have a fever or persistent symptoms for more than 2 to 3 days.  You have a fever and your symptoms suddenly get worse.  Your skin or the white part of your eyes turn yellow (jaundice).  You develop vomiting.  You feel dizzy, or you faint.  Your blood sugar is high (over 300 mg/dL). MAKE SURE YOU:   Understand these instructions.  Will watch your condition.  Will get help right away if you are not doing well or get worse.   This information is not intended to  replace advice given to you by your health care provider. Make sure you discuss any questions you have with your health care provider.   Document Released: 06/21/2005 Document Revised: 12/21/2011 Document Reviewed: 09/30/2011 Elsevier Interactive Patient Education 2016 Elsevier Inc.  Low-Fat Diet for Pancreatitis or Gallbladder Conditions A low-fat diet can be helpful if you have pancreatitis  or a gallbladder condition. With these conditions, your pancreas and gallbladder have trouble digesting fats. A healthy eating plan with less fat will help rest your pancreas and gallbladder and reduce your symptoms. WHAT DO I NEED TO KNOW ABOUT THIS DIET?  Eat a low-fat diet.  Reduce your fat intake to less than 20-30% of your total daily calories. This is less than 50-60 g of fat per day.  Remember that you need some fat in your diet. Ask your dietician what your daily goal should be.  Choose nonfat and low-fat healthy foods. Look for the words "nonfat," "low fat," or "fat free."  As a guide, look on the label and choose foods with less than 3 g of fat per serving. Eat only one serving.  Avoid alcohol.  Do not smoke. If you need help quitting, talk with your health care provider.  Eat small frequent meals instead of three large heavy meals. WHAT FOODS CAN I EAT? Grains Include healthy grains and starches such as potatoes, wheat bread, fiber-rich cereal, and brown rice. Choose whole grain options whenever possible. In adults, whole grains should account for 45-65% of your daily calories.  Fruits and Vegetables Eat plenty of fruits and vegetables. Fresh fruits and vegetables add fiber to your diet. Meats and Other Protein Sources Eat lean meat such as chicken and pork. Trim any fat off of meat before cooking it. Eggs, fish, and beans are other sources of protein. In adults, these foods should account for 10-35% of your daily calories. Dairy Choose low-fat milk and dairy options. Dairy includes fat and protein, as well as calcium.  Fats and Oils Limit high-fat foods such as fried foods, sweets, baked goods, sugary drinks.  Other Creamy sauces and condiments, such as mayonnaise, can add extra fat. Think about whether or not you need to use them, or use smaller amounts or low fat options. WHAT FOODS ARE NOT RECOMMENDED?  High fat foods, such as:  [employer]aked goods.  Ice cream.  [place]  toast.  Sweet rolls.  Pizza.  Cheese bread.  Foods covered with batter, butter, creamy sauces, or cheese.  Fried foods.  Sugary drinks and desserts.  Foods that cause gas or bloating   This information is not intended to replace advice given to you by your health care provider. Make sure you discuss any questions you have with your health care provider.   Document Released: 06/26/2013 Document Reviewed: 06/26/2013 Elsevier Interactive Patient Education [employer] Elsevier Inc.

## 2015-09-15 NOTE — ED Notes (Signed)
Constant bilateral upper abdominal pain since midnight last night.  Pt went to her PCP and was told that her WBC was high.  Pt denies N/V/D

## 2015-09-15 NOTE — ED Notes (Addendum)
abd pain onset at midnight, denies n/v,  Denies urinary sx pt states went to her md today and was told to come here due to high wbc

## 2015-10-23 ENCOUNTER — Other Ambulatory Visit: Payer: Self-pay | Admitting: Gastroenterology

## 2015-11-05 ENCOUNTER — Ambulatory Visit (HOSPITAL_COMMUNITY): Admission: RE | Admit: 2015-11-05 | Payer: 59 | Source: Ambulatory Visit | Admitting: Gastroenterology

## 2015-11-05 ENCOUNTER — Encounter (HOSPITAL_COMMUNITY): Admission: RE | Payer: Self-pay | Source: Ambulatory Visit

## 2015-11-05 SURGERY — UPPER ENDOSCOPIC ULTRASOUND (EUS) RADIAL
Anesthesia: Monitor Anesthesia Care

## 2016-06-06 ENCOUNTER — Encounter (HOSPITAL_BASED_OUTPATIENT_CLINIC_OR_DEPARTMENT_OTHER): Payer: Self-pay | Admitting: *Deleted

## 2016-06-06 ENCOUNTER — Emergency Department (HOSPITAL_BASED_OUTPATIENT_CLINIC_OR_DEPARTMENT_OTHER)
Admission: EM | Admit: 2016-06-06 | Discharge: 2016-06-06 | Disposition: A | Discharge status: Home / Self Care | Payer: 59 | Attending: Emergency Medicine | Admitting: Emergency Medicine

## 2016-06-06 DIAGNOSIS — I10 Essential (primary) hypertension: Secondary | ICD-10-CM | POA: Diagnosis not present

## 2016-06-06 DIAGNOSIS — R040 Epistaxis: Secondary | ICD-10-CM | POA: Diagnosis not present

## 2016-06-06 DIAGNOSIS — Z79899 Other long term (current) drug therapy: Secondary | ICD-10-CM | POA: Insufficient documentation

## 2016-06-06 DIAGNOSIS — Z7982 Long term (current) use of aspirin: Secondary | ICD-10-CM | POA: Diagnosis not present

## 2016-06-06 MED ORDER — OXYMETAZOLINE HCL 0.05 % NA SOLN
NASAL | Status: AC
  Administered 2016-06-06
  Filled 2016-06-06: qty 15

## 2016-06-06 NOTE — ED Provider Notes (Signed)
By signing my name below, I, Maria Wolfe, attest that this documentation has been prepared under the direction and in the presence of Maria Hord N Demauri Advincula, DO. Electronically Signed: Doreatha MartinEva Wolfe, ED Scribe. 06/06/16. 11:10 PM.    TIME SEEN:  11:05 PM   CHIEF COMPLAINT:  Chief Complaint  Patient presents with  . Epistaxis     HPI:  HPI Comments: Maria Wolfe is a 50 y.o. female with h/o HTN on HCTZ and Lasix who presents to the Emergency Department complaining of persistent epistaxis that began this evening after blowing her nose just PTA. Pt also reports a few episodes of emesis after blood clots ran down her throat. She states she attempted applying pressure to the nares with no relief of the bleeding. Pt is currently taking ASA, but has not taken it or her BP medications in a few days. No other antiplatelets or anticoagulants. Denies picking her nose, trauma to the nose. States she thinks the dry air is contributing to her symptoms. She denies additional complaints.   ROS: See HPI Constitutional: no fever  Eyes: no drainage  ENT: no runny nose. +epistaxis  Cardiovascular:  no chest pain  Resp: no SOB  GI: no vomiting GU: no dysuria Integumentary: no rash  Allergy: no hives  Musculoskeletal: no leg swelling  Neurological: no slurred speech ROS otherwise negative  PAST MEDICAL HISTORY/PAST SURGICAL HISTORY:  Past Medical History:  Diagnosis Date  . Aortic stenosis   . Bursitis of hip    right  . Hypertension   . Ovarian cyst    Left  . Rudimentary uterus    Agenesis of vagina    MEDICATIONS:  Prior to Admission medications   Medication Sig Start Date End Date Taking? Authorizing Provider  hydrochlorothiazide (HYDRODIURIL) 25 MG tablet Take 25 mg by mouth daily.   Yes Historical Provider, MD  aspirin 81 MG tablet Take 81 mg by mouth at bedtime.     Historical Provider, MD  furosemide (LASIX) 20 MG tablet Take 20 mg by mouth daily.      Historical Provider, MD   lisdexamfetamine (VYVANSE) 40 MG capsule Take 40 mg by mouth every morning.    Historical Provider, MD  naproxen sodium (ANAPROX) 220 MG tablet Take 220-440 mg by mouth 2 (two) times daily with a meal. Reported on 06/20/2015    Historical Provider, MD  spironolactone (ALDACTONE) 25 MG tablet Take 25 mg by mouth daily.      Historical Provider, MD    ALLERGIES:  Allergies  Allergen Reactions  . Amoxicillin Rash  . Penicillins Rash    SOCIAL HISTORY:  Social History  Substance Use Topics  . Smoking status: Never Smoker  . Smokeless tobacco: Not on file  . Alcohol use No    FAMILY HISTORY: Family History  Problem Relation Age of Onset  . Diabetes Father   . Hypertension Father   . Heart disease Father   . Hypertension Sister   . Breast cancer Maternal Grandmother     Age 49's  . Breast cancer Paternal Grandmother     Age 49's    EXAM: BP 184/98   Pulse 67   Temp 98 F (36.7 C) (Oral)   Resp 18   Ht 5\' 4"  (1.626 m)   Wt 203 lb (92.1 kg)   SpO2 93%   BMI 34.84 kg/m  CONSTITUTIONAL: Alert and oriented and responds appropriately to questions. Well-appearing; well-nourished HEAD: Normocephalic EYES: Conjunctivae clear, PERRL, EOMI ENT: normal nose; no rhinorrhea;  moist mucous membranes; No pharyngeal erythema or petechiae, no tonsillar hypertrophy or exudate, no uvular deviation, no unilateral swelling, no trismus or drooling, no muffled voice, normal phonation, no stridor, no dental caries present, no drainable dental abscess noted, no Ludwig's angina, tongue sits flat in the bottom of the mouth, no angioedema, no facial erythema or warmth, no facial swelling; no pain with movement of the neck; dried blood noted in the right nostril but no septal hematoma or foreign body, no active bleeding, no blood in the posterior oropharynx NECK: Supple, no meningismus, no nuchal rigidity, no LAD  CARD: RRR; S1 and S2 appreciated; no murmurs, no clicks, no rubs, no gallops RESP:  Normal chest excursion without splinting or tachypnea; breath sounds clear and equal bilaterally; no wheezes, no rhonchi, no rales, no hypoxia or respiratory distress, speaking full sentences ABD/GI: Normal bowel sounds; non-distended; soft, non-tender, no rebound, no guarding, no peritoneal signs, no hepatosplenomegaly BACK:  The back appears normal and is non-tender to palpation, there is no CVA tenderness EXT: Normal ROM in all joints; non-tender to palpation; no edema; normal capillary refill; no cyanosis, no calf tenderness or swelling    SKIN: Normal color for age and race; warm; no rash NEURO: Moves all extremities equally, sensation to light touch intact diffusely, cranial nerves II through XII intact, normal speech PSYCH: The patient's mood and manner are appropriate. Grooming and personal hygiene are appropriate.  MEDICAL DECISION MAKING: Patient here with epistaxis. Prior to me arriving in the room patient has received Afrin and has a clamp on her nose for pressure. Will reassess in 30 minutes. Blood pressure high but improving slowly.  ED PROGRESS: 11:45 AM  Clamp removed from patient's nose. She does have some blood in the right nostril but no active bleeding. No septal hematoma. No blood noted in the posterior oropharynx. Have advised patient not to blow her nose for the next 3 days, but a thing and her nose other than nasal saline. Recommended using a humidifier at home. Recommended she start her blood pressure medications when she gets home. We'll discharge with the bottle of Afrin was used in the emergency department case her nose starts to bleed again. Discussed with patient and her partner at bedside with a due of nosebleeds again at home. We'll discharge her PCP and ENT follow-up. Discussed return precautions. They verbalize understanding and are comfortable with this plan.   At this time, I do not feel there is any life-threatening condition present. I have reviewed and discussed  all results (EKG, imaging, lab, urine as appropriate) and exam findings with patient/family. I have reviewed nursing notes and appropriate previous records.  I feel the patient is safe to be discharged home without further emergent workup and can continue workup as an outpatient as needed. Discussed usual and customary return precautions. Patient/family verbalize understanding and are comfortable with this plan.  Outpatient follow-up has been provided. All questions have been answered.      I personally performed the services described in this documentation, which was scribed in my presence. The recorded information has been reviewed and is accurate.    Layla MawKristen N Dyneshia Baccam, DO 06/06/16 [phone]

## 2016-06-06 NOTE — Discharge Instructions (Addendum)
You may use a humidifier at home to help keep the air from being so dry that it causes a nosebleed. You may use nasal saline over-the-counter to help keep your nasal passages moist.  To bleed again, I recommended you below your nose and blow out all of the clots, spray multiple sprays of Afrin into both sides and hold pressure for 30 minutes without stopping. Ifwe does not stop at this point, please return to the hospital. If you continue to have recurrent nosebleeds, I recommend follow-up with ENT.  Please do not blow your nose, put anything in your nose or put anything into your nose for the next 3 days.  Please take her blood pressure medications as prescribed. If you have a difficult time taking your diuretics because of increased urination, it may be worthwhile talking to your remarry care physician to see if there is another medication that you could be on to control your blood pressure.

## 2016-06-06 NOTE — ED Notes (Signed)
Afrin given  patient 1 spray in each nostril per protocol and MD order   - bleeding controlled at this time.

## 2016-06-06 NOTE — ED Triage Notes (Signed)
Nose bleed x 30 minutes PTA.  Denies known injury.  Multiple large blood clots noted in triage.

## 2016-06-21 ENCOUNTER — Encounter: Payer: Self-pay | Admitting: Gynecology

## 2016-06-21 ENCOUNTER — Ambulatory Visit (INDEPENDENT_AMBULATORY_CARE_PROVIDER_SITE_OTHER): Payer: 59 | Admitting: Gynecology

## 2016-06-21 VITALS — BP 134/88 | Ht 64.0 in | Wt 207.0 lb

## 2016-06-21 DIAGNOSIS — Z01411 Encounter for gynecological examination (general) (routine) with abnormal findings: Secondary | ICD-10-CM

## 2016-06-21 DIAGNOSIS — Q520 Congenital absence of vagina: Secondary | ICD-10-CM | POA: Diagnosis not present

## 2016-06-21 NOTE — Progress Notes (Signed)
Maria FellingMargaret D Wolfe [birth year] [id number]   History:    50 y.o.  for annual gyn exam with no complaints today.Patient has Rokitansky-Kuster-Hauser syndrome (mllerian dysgenesis). Patient several years ago has had 2 laparoscopic procedures both describing bilateral normal-appearing fallopian tubes and "rudimentary uterine horn bilaterally". There was no report of any endometriosis. Patient has had a functional vagina with the use of dilators and has had no problem with sexual intercourse. Patient had [facility] endocrinology and infertility. He had commented in one of his consultation note that pregnancy was not going to be possible because she did not have a functional endometrium despite normal ovarian function. She had been interested in the past for surrogacy. Patient was reported to have had an abnormal AMH in April 2013. She is also being followed by the cardiologist whereby she has had prior history of pulmonary valve replacement.  In 2001 she had a Pap smear showing low grade vaginal dysplasia without high-risk HPV detected. We did not initially colposcope her but repeated her Pap and she's had 12 normal Pap smears. Her last Pap was 2012. At some point a CT scan was done for increased echogenicity in the left mid kidney. A small adrenal adenoma on the left adrenal gland was seen. This was April, 2006. Followup limited CT scan in May, 2007 showed a small stable left adrenal adenoma with Dr. Luvenia StarchMaxwell's opinion that no further evaluation was recommended.  Patient had an ultrasound the office 06/22/2014 the following findings: Under developed uterus homogeneous myometrium.Then echogenic endometrium. Right ovary with a small 8 mm follicle was noted along with a thin-walled cyst measuring 26 x 25 x 21 mm with internal low level echoes with positive color flow to the wall of the cyst. Possible corpus luteum cyst. Left ovary 2  follicles one measuring 20 x 15 mm the second measuring 10 x 10 mm. There was no fluid in the cul-de-sac   in 2014 patient had a normal FSH and AMH and was referred for in vitro fertilization with the utilization of surrogate and sperm donor with Dr.Dr. Minda Meoammer Yalcinkaya reproductive endocrinologist. She did see him this summer and he had inform her that after stimulation she did not produce eggs so she would not be a candidate for consideration of surrogacy.   Past medical history,surgical history, family history and social history were all reviewed and documented in the EPIC chart.  Gynecologic History No LMP recorded. Patient is not currently having periods (Reason: Other). Contraception: none Last Pap: 2016. Results were: normal Last mammogram: 2016. Results were: normal  Obstetric History OB History  Gravida Para Term Preterm AB Living  0            SAB TAB Ectopic Multiple Live Births                    ROS: A ROS was performed and pertinent positives and negatives are included in the history.  GENERAL: No fevers or chills. HEENT: No change in vision, no earache, sore throat or sinus congestion. NECK: No pain or stiffness. CARDIOVASCULAR: No chest pain or pressure. No palpitations. PULMONARY: No shortness of breath, cough or wheeze. GASTROINTESTINAL: No abdominal pain, nausea, vomiting or diarrhea, melena or bright red blood per rectum. GENITOURINARY: No urinary frequency, urgency, hesitancy or dysuria. MUSCULOSKELETAL: No joint or muscle pain, no back pain, no recent trauma. DERMATOLOGIC: No rash, no itching, no lesions. ENDOCRINE: No polyuria, polydipsia, no  heat or cold intolerance. No recent change in weight. HEMATOLOGICAL: No anemia or easy bruising or bleeding. NEUROLOGIC: No headache, seizures, numbness, tingling or weakness. PSYCHIATRIC: No depression, no loss of interest in normal activity or change in sleep pattern.     Exam: chaperone present  BP 134/88   Ht 5\' 4"   (1.626 m)   Wt 207 lb (93.9 kg)   BMI 35.53 kg/m   Body mass index is 35.53 kg/m.  General appearance : Well developed well nourished female. No acute distress HEENT: Eyes: no retinal hemorrhage or exudates,  Neck supple, trachea midline, no carotid bruits, no thyroidmegaly Lungs: Clear to auscultation, no rhonchi or wheezes, or rib retractions  Heart: Regular rate and rhythm, no murmurs or gallops Breast:Examined in sitting and supine position were symmetrical in appearance, no palpable masses or tenderness,  no skin retraction, no nipple inversion, no nipple discharge, no skin discoloration, no axillary or supraclavicular lymphadenopathy Abdomen: no palpable masses or tenderness, no rebound or guarding Extremities: no edema or skin discoloration or tenderness  Pelvic:  Bartholin, Urethra, Skene Glands: Within normal limits             Vagina: No gross lesions or discharge  Cervix: Absent  Uterus  small barely palpable  Adnexa  Without masses or tenderness  Anus and perineum  normal   Rectovaginal  normal sphincter tone without palpated masses or tenderness             Hemoccult not indicated     Assessment/Plan:  50 y.o. female for annual exam 50 year old female for annual exam  With Rokitansky-Kuster-Hauser with past history of ovarian cyst was follow-up ultrasound demonstrating completely resolution. Patient will return to the office next week in a fasting state for the following screening blood work: Fasting lipid profile, comprehensive metabolic panel, TSH, CBC, and urinalysis. Pap smear not done this year. Patient's flu vaccine is up-to-date.   Ok EdwardsFERNANDEZ,Abigaile Rossie H MD, 5:36 PM 06/21/2016

## 2016-06-22 ENCOUNTER — Other Ambulatory Visit: Payer: Self-pay | Admitting: Family Medicine

## 2016-06-22 ENCOUNTER — Other Ambulatory Visit: Payer: 59

## 2016-06-22 DIAGNOSIS — Z1231 Encounter for screening mammogram for malignant neoplasm of breast: Secondary | ICD-10-CM

## 2016-06-22 LAB — COMPREHENSIVE METABOLIC PANEL
ALT: 27 U/L (ref 6–29)
AST: 21 U/L (ref 10–35)
Albumin: 3.8 g/dL (ref 3.6–5.1)
Alkaline Phosphatase: 88 U/L (ref 33–115)
BILIRUBIN TOTAL: 0.7 mg/dL (ref 0.2–1.2)
BUN: 12 mg/dL (ref 7–25)
CHLORIDE: 104 mmol/L (ref 98–110)
CO2: 26 mmol/L (ref 20–31)
CREATININE: 0.85 mg/dL (ref 0.50–1.10)
Calcium: 8.8 mg/dL (ref 8.6–10.2)
GLUCOSE: 99 mg/dL (ref 65–99)
Potassium: 3.9 mmol/L (ref 3.5–5.3)
SODIUM: 138 mmol/L (ref 135–146)
Total Protein: 6.8 g/dL (ref 6.1–8.1)

## 2016-06-22 LAB — URINALYSIS W MICROSCOPIC + REFLEX CULTURE
Bacteria, UA: NONE SEEN [HPF]
Bilirubin Urine: NEGATIVE
Casts: NONE SEEN [LPF]
Crystals: NONE SEEN [HPF]
Glucose, UA: NEGATIVE
Hgb urine dipstick: NEGATIVE
Ketones, ur: NEGATIVE
Leukocytes, UA: NEGATIVE
Nitrite: NEGATIVE
Protein, ur: NEGATIVE
RBC / HPF: NONE SEEN RBC/HPF (ref <=2)
Specific Gravity, Urine: 1.02 (ref 1.001–1.035)
Squamous Epithelial / HPF: NONE SEEN [HPF] (ref <=5)
WBC, UA: NONE SEEN WBC/HPF (ref <=5)
Yeast: NONE SEEN [HPF]
pH: 5.5 (ref 5.0–8.0)

## 2016-06-22 LAB — TSH: TSH: 4.32 m[IU]/L

## 2016-06-22 LAB — CBC WITH DIFFERENTIAL/PLATELET
Basophils Absolute: 0 cells/uL (ref 0–200)
Basophils Relative: 0 %
EOS PCT: 2 %
Eosinophils Absolute: 152 cells/uL (ref 15–500)
HCT: 43.3 % (ref 35.0–45.0)
HEMOGLOBIN: 14.8 g/dL (ref 11.7–15.5)
LYMPHS ABS: 1216 {cells}/uL (ref 850–3900)
LYMPHS PCT: 16 %
MCH: 30.3 pg (ref 27.0–33.0)
MCHC: 34.2 g/dL (ref 32.0–36.0)
MCV: 88.5 fL (ref 80.0–100.0)
MONOS PCT: 7 %
MPV: 9.1 fL (ref 7.5–12.5)
Monocytes Absolute: 532 cells/uL (ref 200–950)
NEUTROS PCT: 75 %
Neutro Abs: 5700 cells/uL (ref 1500–7800)
PLATELETS: 208 10*3/uL (ref 140–400)
RBC: 4.89 MIL/uL (ref 3.80–5.10)
RDW: 13.3 % (ref 11.0–15.0)
WBC: 7.6 10*3/uL (ref 3.8–10.8)

## 2016-06-22 LAB — LIPID PANEL
Cholesterol: 143 mg/dL (ref <200)
HDL: 33 mg/dL — ABNORMAL LOW (ref >50)
LDL Cholesterol: 98 mg/dL (ref <100)
Total CHOL/HDL Ratio: 4.3 Ratio (ref <5.0)
Triglycerides: 60 mg/dL (ref <150)
VLDL: 12 mg/dL (ref <30)

## 2016-07-14 ENCOUNTER — Ambulatory Visit: Payer: 59

## 2016-07-16 ENCOUNTER — Ambulatory Visit
Admission: RE | Admit: 2016-07-16 | Discharge: 2016-07-16 | Disposition: A | Discharge status: Home / Self Care | Payer: 59 | Source: Ambulatory Visit | Attending: Family Medicine | Admitting: Family Medicine

## 2016-07-16 DIAGNOSIS — Z1231 Encounter for screening mammogram for malignant neoplasm of breast: Secondary | ICD-10-CM

## 2016-07-19 DIAGNOSIS — J069 Acute upper respiratory infection, unspecified: Secondary | ICD-10-CM | POA: Diagnosis not present

## 2016-08-24 ENCOUNTER — Ambulatory Visit: Payer: 59 | Admitting: Gynecology

## 2016-08-24 DIAGNOSIS — L602 Onychogryphosis: Secondary | ICD-10-CM | POA: Diagnosis not present

## 2016-08-24 DIAGNOSIS — B351 Tinea unguium: Secondary | ICD-10-CM | POA: Diagnosis not present

## 2016-08-25 ENCOUNTER — Other Ambulatory Visit: Payer: Self-pay | Admitting: Gynecology

## 2016-08-25 ENCOUNTER — Encounter: Payer: Self-pay | Admitting: Gynecology

## 2016-08-25 ENCOUNTER — Ambulatory Visit (INDEPENDENT_AMBULATORY_CARE_PROVIDER_SITE_OTHER): Payer: 59 | Admitting: Gynecology

## 2016-08-25 VITALS — BP 128/80 | Ht 64.0 in | Wt 208.0 lb

## 2016-08-25 DIAGNOSIS — N898 Other specified noninflammatory disorders of vagina: Secondary | ICD-10-CM | POA: Diagnosis not present

## 2016-08-25 DIAGNOSIS — N760 Acute vaginitis: Secondary | ICD-10-CM

## 2016-08-25 DIAGNOSIS — B9689 Other specified bacterial agents as the cause of diseases classified elsewhere: Secondary | ICD-10-CM | POA: Diagnosis not present

## 2016-08-25 DIAGNOSIS — N949 Unspecified condition associated with female genital organs and menstrual cycle: Secondary | ICD-10-CM | POA: Diagnosis not present

## 2016-08-25 LAB — WET PREP FOR TRICH, YEAST, CLUE
Trich, Wet Prep: NONE SEEN
WBC, Wet Prep HPF POC: NONE SEEN
Yeast Wet Prep HPF POC: NONE SEEN

## 2016-08-25 LAB — URINALYSIS W MICROSCOPIC + REFLEX CULTURE
Bilirubin Urine: NEGATIVE
CASTS: NONE SEEN [LPF]
Crystals: NONE SEEN [HPF]
Glucose, UA: NEGATIVE
Hgb urine dipstick: NEGATIVE
KETONES UR: NEGATIVE
Leukocytes, UA: NEGATIVE
NITRITE: NEGATIVE
Protein, ur: NEGATIVE
RBC / HPF: NONE SEEN RBC/HPF (ref <=2)
SPECIFIC GRAVITY, URINE: 1.02 (ref 1.001–1.035)
YEAST: NONE SEEN [HPF]
pH: 7.5 (ref 5.0–8.0)

## 2016-08-25 MED ORDER — TINIDAZOLE 500 MG PO TABS: ORAL_TABLET | ORAL | 0 refills | Status: DC

## 2016-08-25 NOTE — Addendum Note (Signed)
Addended by: Kem ParkinsonBARNES, Shantay Sonn on: 08/25/2016 10:26 AM   Modules accepted: Orders

## 2016-08-25 NOTE — Patient Instructions (Signed)
Bacterial Vaginosis Bacterial vaginosis is a vaginal infection that occurs when the normal balance of bacteria in the vagina is disrupted. It results from an overgrowth of certain bacteria. This is the most common vaginal infection among women ages 15-44. Because bacterial vaginosis increases your risk for STIs (sexually transmitted infections), getting treated can help reduce your risk for chlamydia, gonorrhea, herpes, and HIV (human immunodeficiency virus). Treatment is also important for preventing complications in pregnant women, because this condition can cause an early (premature) delivery. What are the causes? This condition is caused by an increase in harmful bacteria that are normally present in small amounts in the vagina. However, the reason that the condition develops is not fully understood. What increases the risk? The following factors may make you more likely to develop this condition:  Having a new sexual partner or multiple sexual partners.  Having unprotected sex.  Douching.  Having an intrauterine device (IUD).  Smoking.  Drug and alcohol abuse.  Taking certain antibiotic medicines.  Being pregnant.  You cannot get bacterial vaginosis from toilet seats, bedding, swimming pools, or contact with objects around you. What are the signs or symptoms? Symptoms of this condition include:  Grey or white vaginal discharge. The discharge can also be watery or foamy.  A fish-like odor with discharge, especially after sexual intercourse or during menstruation.  Itching in and around the vagina.  Burning or pain with urination.  Some women with bacterial vaginosis have no signs or symptoms. How is this diagnosed? This condition is diagnosed based on:  Your medical history.  A physical exam of the vagina.  Testing a sample of vaginal fluid under a microscope to look for a large amount of bad bacteria or abnormal cells. Your health care provider may use a cotton swab  or a small wooden spatula to collect the sample.  How is this treated? This condition is treated with antibiotics. These may be given as a pill, a vaginal cream, or a medicine that is put into the vagina (suppository). If the condition comes back after treatment, a second round of antibiotics may be needed. Follow these instructions at home: Medicines  Take over-the-counter and prescription medicines only as told by your health care provider.  Take or use your antibiotic as told by your health care provider. Do not stop taking or using the antibiotic even if you start to feel better. General instructions  If you have a female sexual partner, tell her that you have a vaginal infection. She should see her health care provider and be treated if she has symptoms. If you have a female sexual partner, he does not need treatment.  During treatment: ? Avoid sexual activity until you finish treatment. ? Do not douche. ? Avoid alcohol as directed by your health care provider. ? Avoid breastfeeding as directed by your health care provider.  Drink enough water and fluids to keep your urine clear or pale yellow.  Keep the area around your vagina and rectum clean. ? Wash the area daily with warm water. ? Wipe yourself from front to back after using the toilet.  Keep all follow-up visits as told by your health care provider. This is important. How is this prevented?  Do not douche.  Wash the outside of your vagina with warm water only.  Use protection when having sex. This includes latex condoms and dental dams.  Limit how many sexual partners you have. To help prevent bacterial vaginosis, it is best to have sex with just   one partner (monogamous).  Make sure you and your sexual partner are tested for STIs.  Wear cotton or cotton-lined underwear.  Avoid wearing tight pants and pantyhose, especially during summer.  Limit the amount of alcohol that you drink.  Do not use any products that  contain nicotine or tobacco, such as cigarettes and e-cigarettes. If you need help quitting, ask your health care provider.  Do not use illegal drugs. Where to find more information:  Centers for Disease Control and Prevention: www.cdc.gov/std  American Sexual Health Association (ASHA): www.ashastd.org  U.S. Department of Health and Human Services, Office on Women's Health: www.womenshealth.gov/ or https://www.womenshealth.gov/a-z-topics/bacterial-vaginosis Contact a health care provider if:  Your symptoms do not improve, even after treatment.  You have more discharge or pain when urinating.  You have a fever.  You have pain in your abdomen.  You have pain during sex.  You have vaginal bleeding between periods. Summary  Bacterial vaginosis is a vaginal infection that occurs when the normal balance of bacteria in the vagina is disrupted.  Because bacterial vaginosis increases your risk for STIs (sexually transmitted infections), getting treated can help reduce your risk for chlamydia, gonorrhea, herpes, and HIV (human immunodeficiency virus). Treatment is also important for preventing complications in pregnant women, because the condition can cause an early (premature) delivery.  This condition is treated with antibiotic medicines. These may be given as a pill, a vaginal cream, or a medicine that is put into the vagina (suppository). This information is not intended to replace advice given to you by your health care provider. Make sure you discuss any questions you have with your health care provider. Document Released: 06/21/2005 Document Revised: 03/06/2016 Document Reviewed: 03/06/2016 Elsevier Interactive Patient Education  2017 Elsevier Inc.  

## 2016-08-25 NOTE — Progress Notes (Signed)
   Patient is a 51 year old that presented to the office today complaining of vaginal discomfort and irritation but no true discharge. No dysuria or frequency and no GI complaints.Patient has Rokitansky-Kuster-Hauser syndrome (mllerian dysgenesis).   Exam: Gen. appearance well-developed well-nourished female in no acute distress Abdomen: Soft nontender no rebound or guarding Pelvic: Bartholin urethra Skene glands within normal limits Vagina shortened vagina as a result of her history of Rokitansky-Kuster-Hauser syndrome (mllerian dysgenesis).  Rectal exam: Not done  Urinalysis 0-5 WBC no red blood seen few bacteria  Wet prep few clue cells many bacteria  Assessment/plan: Patient with apparent bacterial vaginosis will be treated with Tindamax 500 mg tablets. She will take 4 tablets today repeat in 24 hours. Urine culture pending.

## 2016-08-28 LAB — URINE CULTURE

## 2016-08-30 ENCOUNTER — Other Ambulatory Visit: Payer: Self-pay | Admitting: Gynecology

## 2016-08-30 MED ORDER — NITROFURANTOIN MONOHYD MACRO 100 MG PO CAPS: 100.0000 mg | ORAL_CAPSULE | Freq: Two times a day (BID) | ORAL | 0 refills | Status: DC

## 2016-09-08 ENCOUNTER — Encounter: Payer: Self-pay | Admitting: Family Medicine

## 2016-09-08 ENCOUNTER — Ambulatory Visit (INDEPENDENT_AMBULATORY_CARE_PROVIDER_SITE_OTHER): Payer: 59 | Admitting: Family Medicine

## 2016-09-08 VITALS — BP 172/93 | HR 67 | Ht 64.0 in | Wt 206.0 lb

## 2016-09-08 DIAGNOSIS — S56911A Strain of unspecified muscles, fascia and tendons at forearm level, right arm, initial encounter: Secondary | ICD-10-CM

## 2016-09-08 DIAGNOSIS — M5416 Radiculopathy, lumbar region: Secondary | ICD-10-CM | POA: Diagnosis not present

## 2016-09-08 NOTE — Patient Instructions (Addendum)
You have lumbar radiculopathy (a pinched nerve in your low back into your right leg). Take tylenol for baseline pain relief (1-2 extra strength tabs 3x/day) A prednisone dose pack is a consideration Avoid ibuprofen, aleve. Stay as active as possible. Physical therapy has been shown to be helpful as well - start this. Strengthening of low back muscles, abdominal musculature are key for long term pain relief. If not improving, will consider further imaging (MRI).   You strained your forearm but this should improve with time, home exercises. Hammer rotation exercise, wrist extension exercise with 1 pound weight - 3 sets of 10 once a day.   Stretching - hold for 20-30 seconds and repeat 3 times. Follow up in 5-6 weeks for reevaluation.

## 2016-09-11 DIAGNOSIS — M5416 Radiculopathy, lumbar region: Secondary | ICD-10-CM | POA: Insufficient documentation

## 2016-09-11 DIAGNOSIS — S56911A Strain of unspecified muscles, fascia and tendons at forearm level, right arm, initial encounter: Secondary | ICD-10-CM | POA: Insufficient documentation

## 2016-09-11 NOTE — Progress Notes (Signed)
PCP: [name],[name], MD  Subjective:   HPI: Patient is a 51 y.o. female here for right leg and right arm pain.  Patient reports she fell about 2 months ago onto her right side. Since that time she's had pain in right leg especially from buttocks to big toe. Worse in the morning. Feels likeleg is going to fall off at times. Pain can be 10/10 level and sharp - pain here and in right forearm now 2/10. No swelling. Right arm pain is more in the forearm laterally. No skin changes, numbness in arm. Has tingling and numbness in the right leg along same distribution as her pain. No bowel/bladder dysfunction.  Past Medical History:  Diagnosis Date  . Aortic stenosis   . Bursitis of hip    right  . Hypertension   . Ovarian cyst    Left  . Rudimentary uterus    Agenesis of vagina    Current Outpatient Prescriptions on File Prior to Visit  Medication Sig Dispense Refill  . aspirin 81 MG tablet Take 81 mg by mouth at bedtime.     . furosemide (LASIX) 20 MG tablet Take 20 mg by mouth daily.      . hydrochlorothiazide (HYDRODIURIL) 25 MG tablet Take 25 mg by mouth daily.    Marland Kitchen. lisdexamfetamine (VYVANSE) 40 MG capsule Take 40 mg by mouth every morning.    . naproxen sodium (ANAPROX) 220 MG tablet Take 220-440 mg by mouth 2 (two) times daily with a meal. Reported on 06/20/2015    . spironolactone (ALDACTONE) 25 MG tablet Take 25 mg by mouth daily.      Marland Kitchen. tinidazole (TINDAMAX) 500 MG tablet Take four tablets today and four tablets tomorrow at the same time 8 tablet 0   No current facility-administered medications on file prior to visit.     Past Surgical History:  Procedure Laterality Date  . CARDIAC CATHETERIZATION    . CARDIAC SURGERY  2002   Valve replacement  . CARDIAC VALVE SURGERY    . PELVIC LAPAROSCOPY  91,97    Diag. Lap X 2-Ovarian cyst  . ROSS KONNO PROCEDURE    . WRIST SURGERY      Allergies  Allergen Reactions  . Lisinopril Other (See Comments)    Developed presumed  drug induced pancreatitis after taking   . Amoxicillin Rash  . Penicillins Rash    Social History   Social History  . Marital status: Single    Spouse name: N/A  . Number of children: N/A  . Years of education: N/A   Occupational History  . Not on file.   Social History Main Topics  . Smoking status: Never Smoker  . Smokeless tobacco: Never Used  . Alcohol use No  . Drug use: No  . Sexual activity: No   Other Topics Concern  . Not on file   Social History Narrative  . No narrative on file    Family History  Problem Relation Age of Onset  . Diabetes Father   . Hypertension Father   . Heart disease Father   . Hypertension Sister   . Breast cancer Maternal Grandmother     Age 50's  . Breast cancer Paternal Grandmother     Age 50's    BP (!) 172/93   Pulse 67   Ht 5\' 4"  (1.626 m)   Wt 206 lb (93.4 kg)   BMI 35.36 kg/m   Review of Systems: See HPI above.     Objective:  Physical  Exam:  Gen: NAD, comfortable in exam room  Back/right leg: No gross deformity, scoliosis. No TTP.  No midline or bony TTP. FROM. Strength LEs 5/5 all muscle groups.   2+ MSRs in patellar and achilles tendons, equal bilaterally. Negative SLRs. Sensation intact to light touch bilaterally. Negative logroll bilateral hips Negative fabers and piriformis stretches.  Right arm: No gross deformity, swelling, bruising. TTP extensor area of forearm but not at elbow.  No other tenderness. FROM elbow, wrist.  Minimal pain on wrist extension. Collateral ligaments intact of elbow. NVI distally.   Assessment & Plan:  1. Lumbar radiculopathy - she will start physical therapy.  Tylenol if needed.   Can consider prednisone, MRI if not improving.  Avoid NSAIDs with cardiac history.    2. Right forearm strain - reassured.  Shown home exercises and stretches to do daily. Tylenol if needed.  Consider physical therapy.  F/u in 5-6 weeks.

## 2016-09-11 NOTE — Assessment & Plan Note (Signed)
reassured.  Shown home exercises and stretches to do daily. Tylenol if needed.  Consider physical therapy.  F/u in 5-6 weeks.

## 2016-09-11 NOTE — Assessment & Plan Note (Signed)
she will start physical therapy.  Tylenol if needed.   Can consider prednisone, MRI if not improving.  Avoid NSAIDs with cardiac history.

## 2016-09-27 DIAGNOSIS — N949 Unspecified condition associated with female genital organs and menstrual cycle: Secondary | ICD-10-CM | POA: Diagnosis not present

## 2016-10-14 ENCOUNTER — Ambulatory Visit: Payer: 59 | Admitting: Family Medicine

## 2016-10-14 DIAGNOSIS — R05 Cough: Secondary | ICD-10-CM | POA: Diagnosis not present

## 2016-10-14 DIAGNOSIS — R0981 Nasal congestion: Secondary | ICD-10-CM | POA: Diagnosis not present

## 2016-10-30 DIAGNOSIS — M545 Low back pain: Secondary | ICD-10-CM | POA: Diagnosis not present

## 2016-10-30 DIAGNOSIS — M5431 Sciatica, right side: Secondary | ICD-10-CM | POA: Diagnosis not present

## 2016-11-06 DIAGNOSIS — M5431 Sciatica, right side: Secondary | ICD-10-CM | POA: Diagnosis not present

## 2016-11-06 DIAGNOSIS — M545 Low back pain: Secondary | ICD-10-CM | POA: Diagnosis not present

## 2016-11-11 ENCOUNTER — Ambulatory Visit: Payer: 59 | Admitting: Family Medicine

## 2016-11-17 ENCOUNTER — Encounter: Payer: Self-pay | Admitting: Gynecology

## 2017-01-13 DIAGNOSIS — I10 Essential (primary) hypertension: Secondary | ICD-10-CM | POA: Diagnosis not present

## 2017-01-13 DIAGNOSIS — Z954 Presence of other heart-valve replacement: Secondary | ICD-10-CM | POA: Diagnosis not present

## 2017-01-31 DIAGNOSIS — R0981 Nasal congestion: Secondary | ICD-10-CM | POA: Diagnosis not present

## 2017-02-15 DIAGNOSIS — Z0100 Encounter for examination of eyes and vision without abnormal findings: Secondary | ICD-10-CM | POA: Diagnosis not present

## 2017-06-22 ENCOUNTER — Ambulatory Visit (INDEPENDENT_AMBULATORY_CARE_PROVIDER_SITE_OTHER): Payer: 59 | Admitting: Obstetrics & Gynecology

## 2017-06-22 ENCOUNTER — Encounter: Payer: Self-pay | Admitting: Obstetrics & Gynecology

## 2017-06-22 VITALS — BP 130/88 | Ht 63.0 in | Wt 203.0 lb

## 2017-06-22 DIAGNOSIS — Z01419 Encounter for gynecological examination (general) (routine) without abnormal findings: Secondary | ICD-10-CM

## 2017-06-22 DIAGNOSIS — Q528 Other specified congenital malformations of female genitalia: Secondary | ICD-10-CM

## 2017-06-22 DIAGNOSIS — Q520 Congenital absence of vagina: Secondary | ICD-10-CM | POA: Diagnosis not present

## 2017-06-22 NOTE — Progress Notes (Signed)
Ignacia FellingMargaret D Meisinger 1966/03/23 [id number]   History:    51 y.o. G0 Married  RP:  Established patient presenting for annual gyn exam   HPI:  Rokitansky-Kuster-Hauser syndrome (mllerian dysgenesis).  Seen by Dr Jamse ArnYalcinkya for possibility of surrogate pregnancy, but didn't respond to Hyperstimulation.  Considered adoption, but didn't progress.  Expressing frustration, regrets and sadness about not having conceived/not having a child of her own.  No pelvic pain.  Breasts normal.  Urine and bowel movements normal.  Worried about Anesthesia/Sedation with Colonoscopy, would rather do a Cologuard test.  Past medical history,surgical history, family history and social history were all reviewed and documented in the EPIC chart.  Gynecologic History No LMP recorded. Patient is not currently having periods (Reason: Other). Contraception: Mullerian dysgenesis Last Pap: 06/2015. Results were: ASCUS/HPV HR neg Last mammogram: 07/2016. Results were: normal Colono Not done yet  Obstetric History OB History  Gravida Para Term Preterm AB Living  0            SAB TAB Ectopic Multiple Live Births                    ROS: A ROS was performed and pertinent positives and negatives are included in the history.  GENERAL: No fevers or chills. HEENT: No change in vision, no earache, sore throat or sinus congestion. NECK: No pain or stiffness. CARDIOVASCULAR: No chest pain or pressure. No palpitations. PULMONARY: No shortness of breath, cough or wheeze. GASTROINTESTINAL: No abdominal pain, nausea, vomiting or diarrhea, melena or bright red blood per rectum. GENITOURINARY: No urinary frequency, urgency, hesitancy or dysuria. MUSCULOSKELETAL: No joint or muscle pain, no back pain, no recent trauma. DERMATOLOGIC: No rash, no itching, no lesions. ENDOCRINE: No polyuria, polydipsia, no heat or cold intolerance. No recent change in weight. HEMATOLOGICAL: No anemia or easy bruising or bleeding. NEUROLOGIC: No headache,  seizures, numbness, tingling or weakness. PSYCHIATRIC: No depression, no loss of interest in normal activity or change in sleep pattern.     Exam:   BP 130/88   Ht 5\' 3"  (1.6 m)   Wt 203 lb (92.1 kg)   BMI 35.96 kg/m   Body mass index is 35.96 kg/m.  General appearance : Well developed well nourished female. No acute distress HEENT: Eyes: no retinal hemorrhage or exudates,  Neck supple, trachea midline, no carotid bruits, no thyroidmegaly Lungs: Clear to auscultation, no rhonchi or wheezes, or rib retractions  Heart: Regular rate and rhythm, no murmurs or gallops Breast:Examined in sitting and supine position were symmetrical in appearance, no palpable masses or tenderness,  no skin retraction, no nipple inversion, no nipple discharge, no skin discoloration, no axillary or supraclavicular lymphadenopathy Abdomen: no palpable masses or tenderness, no rebound or guarding Extremities: no edema or skin discoloration or tenderness  Pelvic: Vulva normal  Bartholin, Urethra, Skene Glands: Within normal limits             Vagina: Short vagina.  No cervix/Uterus.  No vaginal discharge.  Pap reflex done.  Adnexa  Without masses or tenderness  Anus and perineum  normal    Assessment/Plan:  51 y.o. female for annual exam   1. 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  Pap reflex done  at the vaginal vault.  Breast exam normal.  Screening mammogram negative January 2018.  Will repeat in January 2019.  Refuses colonoscopy, will consider Cologuard.  Health labs with family physician. -  Pap IG w/ reflex to HPV when ASC-U  2. Mullerian agenesis Regrets/frustration of not having had her own child.  Raising her husband's children.  Per patient, had psychotherapy with husband.  Declines being referred for psychotherapy again.  No evidence of major depression.  No suicidal ideation.  Genia DelMarie-Lyne Nakira Litzau MD,  3:51 PM 06/22/2017

## 2017-06-24 LAB — PAP IG W/ RFLX HPV ASCU

## 2017-06-26 ENCOUNTER — Encounter: Payer: Self-pay | Admitting: Obstetrics & Gynecology

## 2017-06-26 NOTE — Patient Instructions (Signed)
1. 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  Pap reflex done  at the vaginal vault.  Breast exam normal.  Screening mammogram negative January 2018.  Will repeat in January 2019.  Refuses colonoscopy, will consider Cologuard.  Health labs with family physician. - Pap IG w/ reflex to HPV when [facility]  2. Mullerian agenesis Regrets/frustration of not having had her own child.  Raising her husband's children.  Per patient, had psychotherapy with husband.  Declines being referred for psychotherapy again.  No evidence of major depression.  No suicidal ideation.  Grettell, it was a pleasure meeting you today!  I will inform you of your results as soon as available.  Health Maintenance, Female Adopting a healthy lifestyle and getting preventive care can go a long way to promote health and wellness. Talk with your health care provider about what schedule of regular examinations is right for you. This is a good chance for you to check in with your provider about disease prevention and staying healthy. In between checkups, there are plenty of things you can do on your own. Experts have done a lot of research about which lifestyle changes and preventive measures are most likely to keep you healthy. Ask your health care provider for more information. Weight and diet Eat a healthy diet  Be sure to include plenty of vegetables, fruits, low-fat dairy products, and lean protein.  Do not eat a lot of foods high in solid fats, added sugars, or salt.  Get regular exercise. This is one of the most important things you can do for your health. ? Most adults should exercise for at least 150 minutes each week. The exercise should increase your heart rate and make you sweat (moderate-intensity exercise). ? Most adults should also do strengthening exercises at least twice a week. This is in addition to the moderate-intensity  exercise.  Maintain a healthy weight  Body mass index (BMI) is a measurement that can be used to identify possible weight problems. It estimates body fat based on height and weight. Your health care provider can help determine your BMI and help you achieve or maintain a healthy weight.  For females 59 years of age and older: ? A BMI below 18.5 is considered underweight. ? A BMI of 18.5 to 24.9 is normal. ? A BMI of 25 to 29.9 is considered overweight. ? A BMI of 30 and above is considered obese.  Watch levels of cholesterol and blood lipids  You should start having your blood tested for lipids and cholesterol at 50 years of age, then have this test every 5 years.  You may need to have your cholesterol levels checked more often if: ? Your lipid or cholesterol levels are high. ? You are older than 50 years of age. ? You are at high risk for heart disease.  Cancer screening Lung Cancer  Lung cancer screening is recommended for adults 60-24 years old who are at high risk for lung cancer because of a history of smoking.  A yearly low-dose CT scan of the lungs is recommended for people who: ? Currently smoke. ? Have quit within the past 15 years. ? Have at least a 30-pack-year history of smoking. A pack year is smoking an average of one pack of cigarettes a day for 1 year.  Yearly screening should continue until it has been 15 years since you quit.  Yearly screening should stop if you develop a health  problem that would prevent you from having lung cancer treatment.  Breast Cancer  Practice breast self-awareness. This means understanding how your breasts normally appear and feel.  It also means doing regular breast self-exams. Let your health care provider know about any changes, no matter how small.  If you are in your 20s or 30s, you should have a clinical breast exam (CBE) by a health care provider every 1-3 years as part of a regular health exam.  If you are 37 or older, have  a CBE every year. Also consider having a breast X-ray (mammogram) every year.  If you have a family history of breast cancer, talk to your health care provider about genetic screening.  If you are at high risk for breast cancer, talk to your health care provider about having an MRI and a mammogram every year.  Breast cancer gene (BRCA) assessment is recommended for women who have family members with BRCA-related cancers. BRCA-related cancers include: ? Breast. ? Ovarian. ? Tubal. ? Peritoneal cancers.  Results of the assessment will determine the need for genetic counseling and BRCA1 and BRCA2 testing.  Cervical Cancer Your health care provider may recommend that you be screened regularly for cancer of the pelvic organs (ovaries, uterus, and vagina). This screening involves a pelvic examination, including checking for microscopic changes to the surface of your cervix (Pap test). You may be encouraged to have this screening done every 3 years, beginning at age 80.  For women ages 48-65, health care providers may recommend pelvic exams and Pap testing every 3 years, or they may recommend the Pap and pelvic exam, combined with testing for human papilloma virus (HPV), every 5 years. Some types of HPV increase your risk of cervical cancer. Testing for HPV may also be done on women of any age with unclear Pap test results.  Other health care providers may not recommend any screening for nonpregnant women who are considered low risk for pelvic cancer and who do not have symptoms. Ask your health care provider if a screening pelvic exam is right for you.  If you have had past treatment for cervical cancer or a condition that could lead to cancer, you need Pap tests and screening for cancer for at least 20 years after your treatment. If Pap tests have been discontinued, your risk factors (such as having a new sexual partner) need to be reassessed to determine if screening should resume. Some women have  medical problems that increase the chance of getting cervical cancer. In these cases, your health care provider may recommend more frequent screening and Pap tests.  Colorectal Cancer  This type of cancer can be detected and often prevented.  Routine colorectal cancer screening usually begins at 50 years of age and continues through 50 years of age.  Your health care provider may recommend screening at an earlier age if you have risk factors for colon cancer.  Your health care provider may also recommend using home test kits to check for hidden blood in the stool.  A small camera at the end of a tube can be used to examine your colon directly (sigmoidoscopy or colonoscopy). This is done to check for the earliest forms of colorectal cancer.  Routine screening usually begins at age 10.  Direct examination of the colon should be repeated every 5-10 years through 50 years of age. However, you may need to be screened more often if early forms of precancerous polyps or small growths are found.  Skin Cancer  Check your skin from head to toe regularly.  Tell your health care provider about any new moles or changes in moles, especially if there is a change in a mole's shape or color.  Also tell your health care provider if you have a mole that is larger than the size of a pencil eraser.  Always use sunscreen. Apply sunscreen liberally and repeatedly throughout the day.  Protect yourself by wearing long sleeves, pants, a wide-brimmed hat, and sunglasses whenever you are outside.  Heart disease, diabetes, and high blood pressure  High blood pressure causes heart disease and increases the risk of stroke. High blood pressure is more likely to develop in: ? People who have blood pressure in the high end of the normal range (130-139/85-89 mm Hg). ? People who are overweight or obese. ? People who are African American.  If you are 1-43 years of age, have your blood pressure checked every 3-5  years. If you are 38 years of age or older, have your blood pressure checked every year. You should have your blood pressure measured twice-once when you are at a hospital or clinic, and once when you are not at a hospital or clinic. Record the average of the two measurements. To check your blood pressure when you are not at a hospital or clinic, you can use: ? An automated blood pressure machine at a pharmacy. ? A home blood pressure monitor.  If you are between 71 years and 59 years old, ask your health care provider if you should take aspirin to prevent strokes.  Have regular diabetes screenings. This involves taking a blood sample to check your fasting blood sugar level. ? If you are at a normal weight and have a low risk for diabetes, have this test once every three years after 50 years of age. ? If you are overweight and have a high risk for diabetes, consider being tested at a younger age or more often. Preventing infection Hepatitis B  If you have a higher risk for hepatitis B, you should be screened for this virus. You are considered at high risk for hepatitis B if: ? You were born in a country where hepatitis B is common. Ask your health care provider which countries are considered high risk. ? Your parents were born in a high-risk country, and you have not been immunized against hepatitis B (hepatitis B vaccine). ? You have HIV or AIDS. ? You use needles to inject street drugs. ? You live with someone who has hepatitis B. ? You have had sex with someone who has hepatitis B. ? You get hemodialysis treatment. ? You take certain medicines for conditions, including cancer, organ transplantation, and autoimmune conditions.  Hepatitis C  Blood testing is recommended for: ? Everyone born from [birth year] through 1964. ? Anyone with known risk factors for hepatitis C.  Sexually transmitted infections (STIs)  You should be screened for sexually transmitted infections (STIs) including  gonorrhea and chlamydia if: ? You are sexually active and are younger than 50 years of age. ? You are older than 50 years of age and your health care provider tells you that you are at risk for this type of infection. ? Your sexual activity has changed since you were last screened and you are at an increased risk for chlamydia or gonorrhea. Ask your health care provider if you are at risk.  If you do not have HIV, but are at risk, it may be recommended that you take a prescription  medicine daily to prevent HIV infection. This is called pre-exposure prophylaxis (PrEP). You are considered at risk if: ? You are sexually active and do not regularly use condoms or know the HIV status of your partner(s). ? You take drugs by injection. ? You are sexually active with a partner who has HIV.  Talk with your health care provider about whether you are at high risk of being infected with HIV. If you choose to begin PrEP, you should first be tested for HIV. You should then be tested every 3 months for as long as you are taking PrEP. Pregnancy  If you are premenopausal and you may become pregnant, ask your health care provider about preconception counseling.  If you may become pregnant, take 400 to 800 micrograms (mcg) of folic acid every day.  If you want to prevent pregnancy, talk to your health care provider about birth control (contraception). Osteoporosis and menopause  Osteoporosis is a disease in which the bones lose minerals and strength with aging. This can result in serious bone fractures. Your risk for osteoporosis can be identified using a bone density scan.  If you are 50 years of age or older, or if you are at risk for osteoporosis and fractures, ask your health care provider if you should be screened.  Ask your health care provider whether you should take a calcium or vitamin D supplement to lower your risk for osteoporosis.  Menopause may have certain physical symptoms and  risks.  Hormone replacement therapy may reduce some of these symptoms and risks. Talk to your health care provider about whether hormone replacement therapy is right for you. Follow these instructions at home:  Schedule regular health, dental, and eye exams.  Stay current with your immunizations.  Do not use any tobacco products including cigarettes, chewing tobacco, or electronic cigarettes.  If you are pregnant, do not drink alcohol.  If you are breastfeeding, limit how much and how often you drink alcohol.  Limit alcohol intake to no more than 1 drink per day for nonpregnant women. One drink equals 12 ounces of beer, 5 ounces of wine, or 1 ounces of hard liquor.  Do not use street drugs.  Do not share needles.  Ask your health care provider for help if you need support or information about quitting drugs.  Tell your health care provider if you often feel depressed.  Tell your health care provider if you have ever been abused or do not feel safe at home. This information is not intended to replace advice given to you by your health care provider. Make sure you discuss any questions you have with your health care provider. Document Released: 01/04/2011 Document Revised: 11/27/2015 Document Reviewed: 03/25/2015 Elsevier Interactive Patient Education  [employer].

## 2017-06-29 ENCOUNTER — Encounter: Payer: Self-pay | Admitting: *Deleted

## 2017-07-14 DIAGNOSIS — Z954 Presence of other heart-valve replacement: Secondary | ICD-10-CM | POA: Diagnosis not present

## 2017-07-14 DIAGNOSIS — Q231 Congenital insufficiency of aortic valve: Secondary | ICD-10-CM | POA: Diagnosis not present

## 2017-07-21 DIAGNOSIS — Z954 Presence of other heart-valve replacement: Secondary | ICD-10-CM | POA: Diagnosis not present

## 2017-08-08 DIAGNOSIS — R1013 Epigastric pain: Secondary | ICD-10-CM | POA: Diagnosis not present

## 2017-08-23 DIAGNOSIS — R079 Chest pain, unspecified: Secondary | ICD-10-CM | POA: Diagnosis not present

## 2017-08-23 DIAGNOSIS — K859 Acute pancreatitis without necrosis or infection, unspecified: Secondary | ICD-10-CM | POA: Diagnosis not present

## 2017-08-24 ENCOUNTER — Other Ambulatory Visit: Payer: Self-pay | Admitting: Family Medicine

## 2017-08-24 DIAGNOSIS — K859 Acute pancreatitis without necrosis or infection, unspecified: Secondary | ICD-10-CM

## 2017-09-02 ENCOUNTER — Ambulatory Visit
Admission: RE | Admit: 2017-09-02 | Discharge: 2017-09-02 | Disposition: A | Discharge status: Home / Self Care | Payer: 59 | Source: Ambulatory Visit | Attending: Family Medicine | Admitting: Family Medicine

## 2017-09-02 DIAGNOSIS — K859 Acute pancreatitis without necrosis or infection, unspecified: Secondary | ICD-10-CM

## 2017-09-09 DIAGNOSIS — K8689 Other specified diseases of pancreas: Secondary | ICD-10-CM | POA: Diagnosis not present

## 2017-09-09 DIAGNOSIS — K859 Acute pancreatitis without necrosis or infection, unspecified: Secondary | ICD-10-CM | POA: Diagnosis not present

## 2017-10-19 ENCOUNTER — Other Ambulatory Visit: Payer: Self-pay | Admitting: Gastroenterology

## 2017-10-19 DIAGNOSIS — K859 Acute pancreatitis without necrosis or infection, unspecified: Secondary | ICD-10-CM | POA: Diagnosis not present

## 2017-10-19 DIAGNOSIS — Z1211 Encounter for screening for malignant neoplasm of colon: Secondary | ICD-10-CM | POA: Diagnosis not present

## 2017-11-09 ENCOUNTER — Encounter (HOSPITAL_COMMUNITY): Payer: Self-pay

## 2017-11-11 ENCOUNTER — Other Ambulatory Visit: Payer: Self-pay

## 2017-11-11 ENCOUNTER — Encounter (HOSPITAL_COMMUNITY): Payer: Self-pay

## 2017-11-16 ENCOUNTER — Ambulatory Visit (HOSPITAL_COMMUNITY): Admission: RE | Admit: 2017-11-16 | Payer: 59 | Source: Ambulatory Visit | Admitting: Gastroenterology

## 2017-11-16 SURGERY — COLONOSCOPY WITH PROPOFOL
Anesthesia: Monitor Anesthesia Care

## 2018-01-02 DIAGNOSIS — R062 Wheezing: Secondary | ICD-10-CM | POA: Diagnosis not present

## 2018-01-02 DIAGNOSIS — J209 Acute bronchitis, unspecified: Secondary | ICD-10-CM | POA: Diagnosis not present

## 2018-01-12 DIAGNOSIS — I10 Essential (primary) hypertension: Secondary | ICD-10-CM | POA: Diagnosis not present

## 2018-01-12 DIAGNOSIS — Q231 Congenital insufficiency of aortic valve: Secondary | ICD-10-CM | POA: Diagnosis not present

## 2018-01-12 DIAGNOSIS — Z954 Presence of other heart-valve replacement: Secondary | ICD-10-CM | POA: Diagnosis not present

## 2018-04-27 DIAGNOSIS — S50812A Abrasion of left forearm, initial encounter: Secondary | ICD-10-CM | POA: Diagnosis not present

## 2018-05-08 ENCOUNTER — Other Ambulatory Visit: Payer: Self-pay | Admitting: Family Medicine

## 2018-05-08 DIAGNOSIS — Z1231 Encounter for screening mammogram for malignant neoplasm of breast: Secondary | ICD-10-CM

## 2018-05-11 DIAGNOSIS — J069 Acute upper respiratory infection, unspecified: Secondary | ICD-10-CM | POA: Diagnosis not present

## 2018-05-11 DIAGNOSIS — I10 Essential (primary) hypertension: Secondary | ICD-10-CM | POA: Diagnosis not present

## 2018-06-19 ENCOUNTER — Ambulatory Visit: Payer: 59

## 2018-06-23 ENCOUNTER — Encounter: Payer: Self-pay | Admitting: Obstetrics & Gynecology

## 2018-06-23 ENCOUNTER — Ambulatory Visit (INDEPENDENT_AMBULATORY_CARE_PROVIDER_SITE_OTHER): Payer: 59 | Admitting: Obstetrics & Gynecology

## 2018-06-23 VITALS — BP 136/88 | Ht 63.0 in | Wt 202.0 lb

## 2018-06-23 DIAGNOSIS — Z01419 Encounter for gynecological examination (general) (routine) without abnormal findings: Secondary | ICD-10-CM

## 2018-06-23 DIAGNOSIS — Z6835 Body mass index (BMI) 35.0-35.9, adult: Secondary | ICD-10-CM | POA: Diagnosis not present

## 2018-06-23 DIAGNOSIS — Z780 Asymptomatic menopausal state: Secondary | ICD-10-CM

## 2018-06-23 DIAGNOSIS — E6609 Other obesity due to excess calories: Secondary | ICD-10-CM

## 2018-06-23 NOTE — Progress Notes (Signed)
Maria Wolfe [birth year] [id number]   History:    52 y.o. G0 Married.  In the process of adopting 4 children (Children of her husband's children)  RP:  Established patient presenting for annual gyn exam   HPI: Rokitansky-Kuster-Hauser syndrome (mllerian dysgenesis).  Pelvic US 2017 Small uterus/Rt, Lt ovaries present.  Seen by Dr Jamse ArnYalcinkya for possibility of surrogate pregnancy, but didn't respond to Hyperstimulation.  Menopause, well on no HRT.  No pelvic pain.  Breasts normal.  Urine and bowel movements normal.  BMI 35.78.  Started the [employer] diet program.  Health labs with family physician.  Past medical history,surgical history, family history and social history were all reviewed and documented in the EPIC chart.  Gynecologic History No LMP recorded. (Menstrual status: Other). Contraception: post menopausal status Last Pap: 06/2017. Results were: Negative Last mammogram: 06/2017. Results were: Negative.  Scheduled 07/2018 Bone Density: Never Colonoscopy: Will schedule now  Obstetric History OB History  Gravida Para Term Preterm AB Living  0            SAB TAB Ectopic Multiple Live Births                ROS: A ROS was performed and pertinent positives and negatives are included in the history.  GENERAL: No fevers or chills. HEENT: No change in vision, no earache, sore throat or sinus congestion. NECK: No pain or stiffness. CARDIOVASCULAR: No chest pain or pressure. No palpitations. PULMONARY: No shortness of breath, cough or wheeze. GASTROINTESTINAL: No abdominal pain, nausea, vomiting or diarrhea, melena or bright red blood per rectum. GENITOURINARY: No urinary frequency, urgency, hesitancy or dysuria. MUSCULOSKELETAL: No joint or muscle pain, no back pain, no recent trauma. DERMATOLOGIC: No rash, no itching, no lesions. ENDOCRINE: No polyuria, polydipsia, no heat or cold intolerance. No recent change in weight. HEMATOLOGICAL: No anemia or easy bruising or bleeding.  NEUROLOGIC: No headache, seizures, numbness, tingling or weakness. PSYCHIATRIC: No depression, no loss of interest in normal activity or change in sleep pattern.     Exam:   BP 136/88   Ht 5\' 3"  (1.6 m)   Wt 202 lb (91.6 kg)   BMI 35.78 kg/m   Body mass index is 35.78 kg/m.  General appearance : Well developed well nourished female. No acute distress HEENT: Eyes: no retinal hemorrhage or exudates,  Neck supple, trachea midline, no carotid bruits, no thyroidmegaly Lungs: Clear to auscultation, no rhonchi or wheezes, or rib retractions  Heart: Regular rate and rhythm, no murmurs or gallops Breast:Examined in sitting and supine position were symmetrical in appearance, no palpable masses or tenderness,  no skin retraction, no nipple inversion, no nipple discharge, no skin discoloration, no axillary or supraclavicular lymphadenopathy Abdomen: no palpable masses or tenderness, no rebound or guarding Extremities: no edema or skin discoloration or tenderness  Pelvic: Vulva: Normal             Vagina: No gross lesions or discharge  Cervix: No gross lesions or discharge   Uterus  AV, normal size, shape and consistency, non-tender and mobile  Adnexa  Without masses or tenderness  Anus: Normal   Assessment/Plan:  52 y.o. female for annual exam   1. Well female exam with routine gynecological exam Normal gynecologic exam in patient with menopause and history of Rokitansky Koster Hausler syndrome.  Pap test was negative in December 2018.  No indication to repeat this year.  Breast exam normal.  Screening mammogram scheduled for January 2020.  Health labs  with family physician.  Scheduling screening colonoscopy now.  2. Menopause present Well on no hormone replacement therapy.  No postmenopausal bleeding.  Recommend vitamin D supplements, calcium intake of 1500 mg/day and regular weightbearing physical activities.  3. Class 2 obesity due to excess calories without serious comorbidity with  body mass index (BMI) of 35.0 to 35.9 in adult Patient started [employer] diet and physical activity program.  Recommend a low calorie/carb diet with aerobic physical activities 5 times a week and weightlifting every 2 days.  Genia DelMarie-Lyne Moshe Wenger MD, 4:36 PM 06/23/2018

## 2018-06-29 ENCOUNTER — Encounter: Payer: Self-pay | Admitting: *Deleted

## 2018-06-29 ENCOUNTER — Encounter: Payer: Self-pay | Admitting: Obstetrics & Gynecology

## 2018-06-29 NOTE — Patient Instructions (Signed)
1. Well female exam with routine gynecological exam Normal gynecologic exam in patient with menopause and history of Rokitansky Koster Hausler syndrome.  Pap test was negative in December 2018.  No indication to repeat this year.  Breast exam normal.  Screening mammogram scheduled for January 2020.  Health labs with family physician.  Scheduling screening colonoscopy now.  2. Menopause present Well on no hormone replacement therapy.  No postmenopausal bleeding.  Recommend vitamin D supplements, calcium intake of 1500 mg/day and regular weightbearing physical activities.  3. Class 2 obesity due to excess calories without serious comorbidity with body mass index (BMI) of 35.0 to 35.9 in adult Patient started [employer] diet and physical activity program.  Recommend a low calorie/carb diet with aerobic physical activities 5 times a week and weightlifting every 2 days.  Maria Wolfe, it was a pleasure seeing you today!

## 2018-07-13 DIAGNOSIS — I10 Essential (primary) hypertension: Secondary | ICD-10-CM | POA: Diagnosis not present

## 2018-07-13 DIAGNOSIS — Z954 Presence of other heart-valve replacement: Secondary | ICD-10-CM | POA: Diagnosis not present

## 2018-07-24 ENCOUNTER — Ambulatory Visit: Payer: 59

## 2018-08-16 ENCOUNTER — Ambulatory Visit: Payer: 59

## 2018-09-06 ENCOUNTER — Ambulatory Visit
Admission: RE | Admit: 2018-09-06 | Discharge: 2018-09-06 | Disposition: A | Discharge status: Home / Self Care | Payer: 59 | Source: Ambulatory Visit | Attending: Family Medicine | Admitting: Family Medicine

## 2018-09-06 DIAGNOSIS — Z1231 Encounter for screening mammogram for malignant neoplasm of breast: Secondary | ICD-10-CM | POA: Diagnosis not present

## 2018-09-30 DIAGNOSIS — J018 Other acute sinusitis: Secondary | ICD-10-CM | POA: Diagnosis not present

## 2019-03-13 ENCOUNTER — Encounter (HOSPITAL_BASED_OUTPATIENT_CLINIC_OR_DEPARTMENT_OTHER): Payer: Self-pay | Admitting: *Deleted

## 2019-03-13 ENCOUNTER — Other Ambulatory Visit: Payer: Self-pay

## 2019-03-13 ENCOUNTER — Emergency Department (HOSPITAL_BASED_OUTPATIENT_CLINIC_OR_DEPARTMENT_OTHER): Payer: 59

## 2019-03-13 ENCOUNTER — Emergency Department (HOSPITAL_BASED_OUTPATIENT_CLINIC_OR_DEPARTMENT_OTHER)
Admission: EM | Admit: 2019-03-13 | Discharge: 2019-03-13 | Disposition: A | Discharge status: Home / Self Care | Payer: 59 | Attending: Emergency Medicine | Admitting: Emergency Medicine

## 2019-03-13 DIAGNOSIS — Z20828 Contact with and (suspected) exposure to other viral communicable diseases: Secondary | ICD-10-CM | POA: Insufficient documentation

## 2019-03-13 DIAGNOSIS — Z79899 Other long term (current) drug therapy: Secondary | ICD-10-CM | POA: Insufficient documentation

## 2019-03-13 DIAGNOSIS — R0789 Other chest pain: Secondary | ICD-10-CM | POA: Insufficient documentation

## 2019-03-13 DIAGNOSIS — I10 Essential (primary) hypertension: Secondary | ICD-10-CM | POA: Diagnosis not present

## 2019-03-13 DIAGNOSIS — R509 Fever, unspecified: Secondary | ICD-10-CM | POA: Diagnosis not present

## 2019-03-13 DIAGNOSIS — S61452D Open bite of left hand, subsequent encounter: Secondary | ICD-10-CM | POA: Insufficient documentation

## 2019-03-13 DIAGNOSIS — Z952 Presence of prosthetic heart valve: Secondary | ICD-10-CM | POA: Insufficient documentation

## 2019-03-13 DIAGNOSIS — Z7982 Long term (current) use of aspirin: Secondary | ICD-10-CM | POA: Diagnosis not present

## 2019-03-13 DIAGNOSIS — W540XXD Bitten by dog, subsequent encounter: Secondary | ICD-10-CM | POA: Insufficient documentation

## 2019-03-13 LAB — TROPONIN I (HIGH SENSITIVITY)
Troponin I (High Sensitivity): 4 ng/L (ref <18)
Troponin I (High Sensitivity): 4 ng/L (ref <18)

## 2019-03-13 LAB — CBC WITH DIFFERENTIAL/PLATELET
Abs Immature Granulocytes: 0.07 10*3/uL (ref 0.00–0.07)
Basophils Absolute: 0.1 10*3/uL (ref 0.0–0.1)
Basophils Relative: 1 %
Eosinophils Absolute: 0.1 10*3/uL (ref 0.0–0.5)
Eosinophils Relative: 1 %
HCT: 42.2 % (ref 36.0–46.0)
Hemoglobin: 14.3 g/dL (ref 12.0–15.0)
Immature Granulocytes: 1 %
Lymphocytes Relative: 64 %
Lymphs Abs: 7 10*3/uL — ABNORMAL HIGH (ref 0.7–4.0)
MCH: 29.3 pg (ref 26.0–34.0)
MCHC: 33.9 g/dL (ref 30.0–36.0)
MCV: 86.5 fL (ref 80.0–100.0)
Monocytes Absolute: 0.6 10*3/uL (ref 0.1–1.0)
Monocytes Relative: 6 %
Neutro Abs: 2.9 10*3/uL (ref 1.7–7.7)
Neutrophils Relative %: 27 %
Platelets: 220 10*3/uL (ref 150–400)
RBC: 4.88 MIL/uL (ref 3.87–5.11)
RDW: 14.3 % (ref 11.5–15.5)
WBC: 10.9 10*3/uL — ABNORMAL HIGH (ref 4.0–10.5)
nRBC: 0 % (ref 0.0–0.2)

## 2019-03-13 LAB — COMPREHENSIVE METABOLIC PANEL
ALT: 51 U/L — ABNORMAL HIGH (ref 0–44)
AST: 54 U/L — ABNORMAL HIGH (ref 15–41)
Albumin: 3.7 g/dL (ref 3.5–5.0)
Alkaline Phosphatase: 120 U/L (ref 38–126)
Anion gap: 11 (ref 5–15)
BUN: 16 mg/dL (ref 6–20)
CO2: 24 mmol/L (ref 22–32)
Calcium: 8.9 mg/dL (ref 8.9–10.3)
Chloride: 97 mmol/L — ABNORMAL LOW (ref 98–111)
Creatinine, Ser: 1.25 mg/dL — ABNORMAL HIGH (ref 0.44–1.00)
GFR calc Af Amer: 57 mL/min — ABNORMAL LOW (ref >60)
GFR calc non Af Amer: 49 mL/min — ABNORMAL LOW (ref >60)
Glucose, Bld: 118 mg/dL — ABNORMAL HIGH (ref 70–99)
Potassium: 3.4 mmol/L — ABNORMAL LOW (ref 3.5–5.1)
Sodium: 132 mmol/L — ABNORMAL LOW (ref 135–145)
Total Bilirubin: 1 mg/dL (ref 0.3–1.2)
Total Protein: 7.9 g/dL (ref 6.5–8.1)

## 2019-03-13 LAB — LACTIC ACID, PLASMA: Lactic Acid, Venous: 1.6 mmol/L (ref 0.5–1.9)

## 2019-03-13 LAB — SARS CORONAVIRUS 2 BY RT PCR (HOSPITAL ORDER, PERFORMED IN ~~LOC~~ HOSPITAL LAB): SARS Coronavirus 2: NEGATIVE

## 2019-03-13 NOTE — ED Notes (Signed)
Pt c/o chest pain and fever. Pt also reports a feeling of pain going up left arm r/t dog bite. Pt was previously treated for a dog bite to left hand x 2 weeks ago. Pt reports elevated temp after treatment for injury to hand. Pt denies cough, sinus congestion, denies HA.

## 2019-03-13 NOTE — ED Notes (Signed)
ED Provider at bedside. 

## 2019-03-13 NOTE — ED Provider Notes (Signed)
[facility] EMERGENCY DEPARTMENT Provider Note   CSN: [id number] Arrival date & time: 03/13/19  1126     History   Chief Complaint Chief Complaint  Patient presents with  . Wound Check  . Chest Pain    HPI Ignacia FellingMargaret D Damico is a 53 y.o. female.     Patient is a 53 year old female with a significant past medical history of aortic stenosis from a young age who has undergone a Ross procedure for valve replacement and then underwent another procedure in 2012 with ballooning of the valve who is been doing relatively well from a cardiac standpoint but presenting today with 2 weeks of waxing and waning fever shortly after a dog bite where she was placed on doxycycline and is now completing a course of azithromycin.  Patient states the dog is her own and it was a provoked attack.  Low suspicion for rabies.  Patient was started on antibiotics the day it occurred.  However she has noted low-grade temperatures from 99 up to 101 in the last 1-1/2 weeks.  She has noticed some slight exertional dyspnea when walking while holding both of her children are going up stairs some occasional sharp twinges of pain going throughout her chest that is not exertional.  She denies any cough, swelling, abdominal pain, nausea or vomiting.  She has had yet no urinary symptoms or rashes.  No neck pain.  No contact with ticks or COVID contacts.  Patient went to see her doctor today because of the ongoing fever despite the antibiotics for the dog bite and she was sent here for further care.  Patient denies any drainage worsening redness or change in the area where she was bit by the dog.  She denies any tobacco, alcohol or drug use.  She does not take any anticoagulation as the valves are native.  The history is provided by the patient.  Wound Check Associated symptoms include chest pain.  Chest Pain   Past Medical History:  Diagnosis Date  . Aortic stenosis   . Bursitis of hip    right  . Hypertension    . Ovarian cyst    Left  . Rudimentary uterus    Agenesis of vagina    Patient Active Problem List   Diagnosis Date Noted  . Lumbar radiculopathy 09/11/2016  . Forearm strain, right, initial encounter 09/11/2016  . ASCUS favor benign 07/30/2015  . Open angle with borderline findings and low glaucoma risk in both eyes 12/31/2013  . 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 S/P Ross procedure 05/12/2011  . Rudimentary uterus   . Hypertension     Past Surgical History:  Procedure Laterality Date  . CARDIAC CATHETERIZATION    . CARDIAC SURGERY  2002   Valve replacement  . CARDIAC VALVE SURGERY     melody valve  . PELVIC LAPAROSCOPY  91,97    Diag. Lap X 2-Ovarian cyst  . ROSS KONNO PROCEDURE    . WRIST SURGERY       OB History    Gravida  0   Para      Term      Preterm      AB      Living        SAB      TAB      Ectopic      Multiple      Live Births               Home Medications  Prior to Admission medications   Medication Sig Start Date End Date Taking? Authorizing Provider  aspirin 81 MG tablet Take 81 mg by mouth at bedtime.    Yes [provider]  hydrochlorothiazide (HYDRODIURIL) 25 MG tablet Take 25 mg by mouth at bedtime.    Yes [provider]  spironolactone (ALDACTONE) 50 MG tablet Take 50 mg by mouth at bedtime.   Yes [provider]  acetaminophen (TYLENOL) 500 MG tablet Take 500 mg by mouth every 6 (six) hours as needed (for pain.).    [provider]  cetirizine (ZYRTEC) 10 MG tablet Take 10 mg by mouth daily as needed (for sinus issues.).    [provider]    Family History Family History  Problem Relation Age of Onset  . Diabetes Father   . Hypertension Father   . Heart disease Father   . Hypertension Sister   . Breast cancer Paternal Grandmother        Age 63's    Social History Social History   Tobacco Use  . Smoking status: Never Smoker  . Smokeless tobacco:  Never Used  Substance Use Topics  . Alcohol use: No    Alcohol/week: 0.0 standard drinks  . Drug use: No     Allergies   Lisinopril, Amoxicillin, and Penicillins   Review of Systems Review of Systems  Cardiovascular: Positive for chest pain.  All other systems reviewed and are negative.    Physical Exam Updated Vital Signs BP 120/75   Pulse 94   Temp 99.3 F (37.4 C) (Oral)   Resp 20   Ht 5\' 3"  (1.6 m)   Wt 96.2 kg   SpO2 97%   BMI 37.55 kg/m   Physical Exam Vitals signs and nursing note reviewed.  Constitutional:      General: She is in acute distress.     Appearance: She is well-developed.  HENT:     Head: Normocephalic and atraumatic.  Eyes:     Pupils: Pupils are equal, round, and reactive to light.  Cardiovascular:     Rate and Rhythm: Normal rate and regular rhythm.     Heart sounds: Murmur present. Systolic murmur present with a grade of 3/6. No friction rub.  Pulmonary:     Effort: Pulmonary effort is normal.     Breath sounds: Normal breath sounds. No wheezing or rales.  Abdominal:     General: Bowel sounds are normal. There is no distension.     Palpations: Abdomen is soft.     Tenderness: There is no abdominal tenderness. There is no guarding or rebound.  Musculoskeletal: Normal range of motion.        General: No tenderness.       Hands:     Right lower leg: No edema.     Left lower leg: No edema.     Comments: No edema  Skin:    General: Skin is warm and dry.     Findings: No rash.  Neurological:     General: No focal deficit present.     Mental Status: She is alert and oriented to person, place, and time. Mental status is at baseline.     Cranial Nerves: No cranial nerve deficit.  Psychiatric:        Mood and Affect: Mood normal.        Behavior: Behavior normal.        Thought Content: Thought content normal.      ED Treatments / Results  Labs (all labs ordered are listed, but only abnormal results are displayed) Labs Reviewed   CBC WITH DIFFERENTIAL/PLATELET - Abnormal; Notable for the following components:      Result Value   WBC 10.9 (*)    Lymphs Abs 7.0 (*)    All other components within normal limits  COMPREHENSIVE METABOLIC PANEL - Abnormal; Notable for the following components:   Sodium 132 (*)    Potassium 3.4 (*)    Chloride 97 (*)    Glucose, Bld 118 (*)    Creatinine, Ser 1.25 (*)    AST 54 (*)    ALT 51 (*)    GFR calc non Af Amer 49 (*)    GFR calc Af Amer 57 (*)    All other components within normal limits  SARS CORONAVIRUS 2 (HOSPITAL ORDER, [facility])  CULTURE, BLOOD (ROUTINE X 2)  CULTURE, BLOOD (ROUTINE X 2)  CULTURE, BLOOD (SINGLE)  LACTIC ACID, PLASMA  PATHOLOGIST SMEAR REVIEW  TROPONIN I (HIGH SENSITIVITY)  TROPONIN I (HIGH SENSITIVITY)    EKG EKG Interpretation  Date/Time:  Tuesday March 13 2019 11:41:28 EDT Ventricular Rate:  91 PR Interval:    QRS Duration: 112 QT Interval:  391 QTC Calculation: 482 R Axis:   45 Text Interpretation:  Sinus rhythm Borderline intraventricular conduction delay Borderline repolarization abnormality No significant change since last tracing Confirmed by Blanchie Dessert ([id number]) on 03/13/2019 12:21:48 PM   Radiology Dg Chest Port 1 View  Result Date: 03/13/2019 CLINICAL DATA:  Chest pain EXAM: PORTABLE CHEST 1 VIEW COMPARISON:  09/07/2013 FINDINGS: Cardiomegaly status post median sternotomy with pulmonic outflow stent. Both lungs are clear. The visualized skeletal structures are unremarkable. IMPRESSION: No acute abnormality of the lungs in AP portable projection. Cardiomegaly. Electronically Signed   By: Eddie Candle M.D.   On: 03/13/2019 12:59    Procedures Procedures (including critical care time)  Medications Ordered in ED Medications - No data to display   Initial Impression / Assessment and Plan / ED Course  I have reviewed the triage vital signs and the nursing notes.  Pertinent labs & imaging  results that were available during my care of the patient were reviewed by me and considered in my medical decision making (see chart for details).        Pt is a 53 y/o presenting today with waxing and waning fever now for the last 1.5 week with occasional sharp chest pain and mild exertional dyspnea but no cough.  She did have a dog bite about 2 weeks ago but was placed on doxy and then after 6 days was changed to azithro which she is still taking.  Low suspicion for Lyme disease or [facility] spotted fever but she would have already been treated, no evidence of infection at the site of where the dog bite is.  Patient does have a significant history of aortic stenosis status post a Ross procedure as well as a ballooning procedure in 2012.  She does usually take prophylactic antibiotics prior to dental procedures but does not need anticoagulation because these are native valves.  On exam she does have a heart murmur but no physical exam findings concerning for fluid overload.  She has no known COVID contacts and has been quarantining fairly strictly.  No other family members are ill.  Given patient's fevers up to 101.4 intermittently over the last 1-1/2 weeks concern for potential endocarditis but would think less likely given she was placed immediately  on antibiotics.  Also concern for possible COVID.  Patient has lost maybe 1 pound but has not had significant weight loss.  Vital signs are within normal limits except for a temperature of 99.3.  Blood cultures were drawn, chest x-ray shows cardiomegaly but no other acute findings, troponin is within normal limits, lactate within normal limits, CMP with creatinine of 1.25 with mild elevated LFTs and normal anion gap.  Spoke with Dr. Deatra JamesKrasuski patient's cardiologist and he agrees that we need to have the blood cultures to rule out any infection of the valve.  Patient is otherwise well-appearing with reassuring vital signs.  Patient is motivated to go home.   She can get into the clinic on Thursday 2 days from now for an echo to evaluate if she has change in the gradient of from pulmonic valve possibly concerning for infection.  Also she had a third blood culture done here.  Patient is reliable and will be able to follow-up with this plan.  She was cautioned to return immediately if she began feeling worse having worsening symptoms.  Final Clinical Impressions(s) / ED Diagnoses   Final diagnoses:  Fever of unknown origin    ED Discharge Orders    None       Gwyneth SproutPlunkett, Yvette Roark, MD 03/13/19 1512

## 2019-03-13 NOTE — Discharge Instructions (Signed)
You can continue to take Tylenol at this time for the fever.  If you begin feeling significantly worse meaning you feel like you cannot pass out your vomiting you are having worsening shortness of breath or any other concerns please return immediately.  If your blood cultures come back positive someone from the hospital will contact you.  Otherwise plan on getting your echo on Thursday.

## 2019-03-13 NOTE — ED Triage Notes (Addendum)
Dog bite to her left hand 2 weeks ago. She has had previous tx. She is here today for a recheck. She is also having chest pain and upper back pain.

## 2019-03-15 DIAGNOSIS — A689 Relapsing fever, unspecified: Secondary | ICD-10-CM | POA: Insufficient documentation

## 2019-03-15 LAB — PATHOLOGIST SMEAR REVIEW: Path Review: REACTIVE

## 2019-03-18 LAB — CULTURE, BLOOD (ROUTINE X 2)
Culture: NO GROWTH
Culture: NO GROWTH
Special Requests: ADEQUATE

## 2019-03-18 LAB — CULTURE, BLOOD (SINGLE)
Culture: NO GROWTH
Special Requests: ADEQUATE

## 2019-03-19 DIAGNOSIS — T826XXA Infection and inflammatory reaction due to cardiac valve prosthesis, initial encounter: Secondary | ICD-10-CM | POA: Insufficient documentation

## 2019-03-21 DIAGNOSIS — T50905A Adverse effect of unspecified drugs, medicaments and biological substances, initial encounter: Secondary | ICD-10-CM | POA: Insufficient documentation

## 2019-03-21 HISTORY — DX: Adverse effect of unspecified drugs, medicaments and biological substances, initial encounter: T50.905A

## 2019-03-30 ENCOUNTER — Other Ambulatory Visit: Payer: Self-pay | Admitting: Family Medicine

## 2019-03-30 DIAGNOSIS — R9402 Abnormal brain scan: Secondary | ICD-10-CM

## 2019-06-26 ENCOUNTER — Encounter: Payer: 59 | Admitting: Obstetrics & Gynecology

## 2019-07-16 ENCOUNTER — Other Ambulatory Visit: Payer: Self-pay | Admitting: Nurse Practitioner

## 2019-07-16 DIAGNOSIS — Z6836 Body mass index (BMI) 36.0-36.9, adult: Secondary | ICD-10-CM

## 2019-07-16 DIAGNOSIS — U071 COVID-19: Secondary | ICD-10-CM

## 2019-07-16 NOTE — Progress Notes (Signed)
  I connected by phone with Maria Wolfe on 07/16/2019 at 7:35 AM to discuss the potential use of an new treatment for mild to moderate COVID-19 viral infection in non-hospitalized patients.  This patient is a 54 y.o. female that meets the FDA criteria for Emergency Use Authorization of bamlanivimab or casirivimab\imdevimab.  Has a (+) direct SARS-CoV-2 viral test result  Has mild or moderate COVID-19   Is ? 54 years of age and weighs ? 40 kg  Is NOT hospitalized due to COVID-19  Is NOT requiring oxygen therapy or requiring an increase in baseline oxygen flow rate due to COVID-19  Is within 10 days of symptom onset  Has at least one of the high risk factor(s) for progression to severe COVID-19 and/or hospitalization as defined in EUA.  Specific high risk criteria : BMI >/= 35   I have spoken and communicated the following to the patient or parent/caregiver:  1. FDA has authorized the emergency use of bamlanivimab and casirivimab\imdevimab for the treatment of mild to moderate COVID-19 in adults and pediatric patients with positive results of direct SARS-CoV-2 viral testing who are 3 years of age and older weighing at least 40 kg, and who are at high risk for progressing to severe COVID-19 and/or hospitalization.  2. The significant known and potential risks and benefits of bamlanivimab and casirivimab\imdevimab, and the extent to which such potential risks and benefits are unknown.  3. Information on available alternative treatments and the risks and benefits of those alternatives, including clinical trials.  4. Patients treated with bamlanivimab and casirivimab\imdevimab should continue to self-isolate and use infection control measures (e.g., wear mask, isolate, social distance, avoid sharing personal items, clean and disinfect "high touch" surfaces, and frequent handwashing) according to CDC guidelines.   5. The patient or parent/caregiver has the option to accept or refuse  bamlanivimab or casirivimab\imdevimab .  After reviewing this information with the patient, The patient agreed to proceed with receiving the bamlanimivab infusion and will be provided a copy of the Fact sheet prior to receiving the infusion.[name] 07/16/2019 7:35 AM

## 2019-07-17 ENCOUNTER — Encounter (HOSPITAL_COMMUNITY): Payer: Self-pay

## 2019-07-17 ENCOUNTER — Ambulatory Visit (HOSPITAL_COMMUNITY)
Admission: RE | Admit: 2019-07-17 | Discharge: 2019-07-17 | Disposition: A | Discharge status: Home / Self Care | Payer: 59 | Source: Ambulatory Visit | Attending: Pulmonary Disease | Admitting: Pulmonary Disease

## 2019-07-17 DIAGNOSIS — Z23 Encounter for immunization: Secondary | ICD-10-CM | POA: Insufficient documentation

## 2019-07-17 DIAGNOSIS — Z6836 Body mass index (BMI) 36.0-36.9, adult: Secondary | ICD-10-CM | POA: Insufficient documentation

## 2019-07-17 DIAGNOSIS — U071 COVID-19: Secondary | ICD-10-CM | POA: Diagnosis present

## 2019-07-17 MED ORDER — METHYLPREDNISOLONE SODIUM SUCC 125 MG IJ SOLR: 125.0000 mg | Freq: Once | INTRAMUSCULAR | Status: DC | PRN

## 2019-07-17 MED ORDER — ALBUTEROL SULFATE HFA 108 (90 BASE) MCG/ACT IN AERS: 2.0000 | INHALATION_SPRAY | Freq: Once | RESPIRATORY_TRACT | Status: DC | PRN

## 2019-07-17 MED ORDER — EPINEPHRINE 0.3 MG/0.3ML IJ SOAJ: 0.3000 mg | Freq: Once | INTRAMUSCULAR | Status: DC | PRN

## 2019-07-17 MED ORDER — FAMOTIDINE IN NACL 20-0.9 MG/50ML-% IV SOLN: 20.0000 mg | Freq: Once | INTRAVENOUS | Status: DC | PRN

## 2019-07-17 MED ORDER — DIPHENHYDRAMINE HCL 50 MG/ML IJ SOLN: 50.0000 mg | Freq: Once | INTRAMUSCULAR | Status: DC | PRN

## 2019-07-17 MED ORDER — SODIUM CHLORIDE 0.9 % IV SOLN
700.0000 mg | Freq: Once | INTRAVENOUS | Status: AC
  Administered 2019-07-17: 700 mg via INTRAVENOUS
  Filled 2019-07-17: qty 20

## 2019-07-17 MED ORDER — SODIUM CHLORIDE 0.9 % IV SOLN
INTRAVENOUS | Status: DC | PRN
  Administered 2019-07-17: 250 mL via INTRAVENOUS

## 2019-07-17 NOTE — Progress Notes (Signed)
  Diagnosis: COVID-19  Physician: Dr. Wright  Procedure: [facility] Med: bamlanivimab infusion - Provided patient with bamlanimivab fact sheet for patients, parents and caregivers prior to infusion.  Complications: No immediate complications noted.  Discharge: Discharged home   Maria Wolfe A 07/17/2019  

## 2019-07-17 NOTE — Discharge Instructions (Signed)

## 2019-07-18 ENCOUNTER — Ambulatory Visit (HOSPITAL_COMMUNITY): Payer: 59

## 2019-08-07 ENCOUNTER — Encounter: Payer: 59 | Admitting: Obstetrics & Gynecology

## 2019-08-24 ENCOUNTER — Ambulatory Visit (INDEPENDENT_AMBULATORY_CARE_PROVIDER_SITE_OTHER): Payer: 59 | Admitting: Obstetrics & Gynecology

## 2019-08-24 ENCOUNTER — Other Ambulatory Visit: Payer: Self-pay

## 2019-08-24 ENCOUNTER — Encounter: Payer: Self-pay | Admitting: Obstetrics & Gynecology

## 2019-08-24 VITALS — BP 126/80 | Ht 63.0 in | Wt 214.0 lb

## 2019-08-24 DIAGNOSIS — E6609 Other obesity due to excess calories: Secondary | ICD-10-CM | POA: Diagnosis not present

## 2019-08-24 DIAGNOSIS — Z780 Asymptomatic menopausal state: Secondary | ICD-10-CM | POA: Diagnosis not present

## 2019-08-24 DIAGNOSIS — Z01419 Encounter for gynecological examination (general) (routine) without abnormal findings: Secondary | ICD-10-CM

## 2019-08-24 DIAGNOSIS — Z6837 Body mass index (BMI) 37.0-37.9, adult: Secondary | ICD-10-CM | POA: Diagnosis not present

## 2019-08-24 NOTE — Progress Notes (Signed)
Maria Wolfe 02-Jun-1966 [id number]   History:    54 y.o.  G0 Married.  In the process of adopting 5 children (Children of her husband's children)  RP:  Established patient presenting for annual gyn exam   HPI: Rokitansky-Kuster-Hauser syndrome (mllerian dysgenesis).  Pelvic US 2017 Small uterus/Rt, Lt ovaries present. Seen by Dr Jamse Arn for possibility of surrogate pregnancy, but didn't respond to Hyperstimulation.  Postmenopause, well on no HRT.  No pelvic pain. Breasts normal. Urine and bowel movements normal.  BMI increased to 37.91.  Started the [employer].  Health labs with family physician.  Corona virus pos 07/2019.   Past medical history,surgical history, family history and social history were all reviewed and documented in the EPIC chart.  Gynecologic History No LMP recorded. (Menstrual status: Other).  Obstetric History OB History  Gravida Para Term Preterm AB Living  0 0 0 0 0 0  SAB TAB Ectopic Multiple Live Births  0 0 0 0 0  Obstetric Comments  Pt has 5 adopted kids     ROS: A ROS was performed and pertinent positives and negatives are included in the history.  GENERAL: No fevers or chills. HEENT: No change in vision, no earache, sore throat or sinus congestion. NECK: No pain or stiffness. CARDIOVASCULAR: No chest pain or pressure. No palpitations. PULMONARY: No shortness of breath, cough or wheeze. GASTROINTESTINAL: No abdominal pain, nausea, vomiting or diarrhea, melena or bright red blood per rectum. GENITOURINARY: No urinary frequency, urgency, hesitancy or dysuria. MUSCULOSKELETAL: No joint or muscle pain, no back pain, no recent trauma. DERMATOLOGIC: No rash, no itching, no lesions. ENDOCRINE: No polyuria, polydipsia, no heat or cold intolerance. No recent change in weight. HEMATOLOGICAL: No anemia or easy bruising or bleeding. NEUROLOGIC: No headache, seizures, numbness, tingling or weakness. PSYCHIATRIC: No depression, no loss of interest  in normal activity or change in sleep pattern.     Exam:   BP 126/80 (BP Location: Right Arm, Patient Position: Sitting, Cuff Size: Large)   Ht 5\' 3"  (1.6 m)   Wt 214 lb (97.1 kg)   BMI 37.91 kg/m   Body mass index is 37.91 kg/m.  General appearance : Well developed well nourished female. No acute distress HEENT: Eyes: no retinal hemorrhage or exudates,  Neck supple, trachea midline, no carotid bruits, no thyroidmegaly Lungs: Clear to auscultation, no rhonchi or wheezes, or rib retractions  Heart: Regular rate and rhythm, no murmurs or gallops Breast:Examined in sitting and supine position were symmetrical in appearance, no palpable masses or tenderness,  no skin retraction, no nipple inversion, no nipple discharge, no skin discoloration, no axillary or supraclavicular lymphadenopathy Abdomen: no palpable masses or tenderness, no rebound or guarding Extremities: no edema or skin discoloration or tenderness  Pelvic: Vulva: Normal             Vagina: No gross lesions or discharge.  Very short and narrow.  Cervix: Not well seen.  No gross lesions or discharge.  Pap reflex done.  Uterus  Not felt (Very small per previous d/t RKH Syndrome)  Adnexa  Without masses or tenderness  Anus: Normal   Assessment/Plan:  54 y.o. female for annual exam   1. Encounter for routine gynecological examination with Papanicolaou smear of cervix Gynecologic exam status post menopause with 40 syndrome.  Pap reflex done.  Breast exam normal.  Last screening mammogram March 2020 was negative.  Declines colonoscopy at this time.  Health labs with family physician.  2. Postmenopausal Well on no hormone replacement therapy.  3. Class 2 obesity due to excess calories without serious comorbidity with body mass index (BMI) of 37.0 to 37.9 in adult Recommend a lower calorie/carb diet such as [employer].  Aerobic physical activities 5 times a week and light weightlifting every 2  days.  Princess Bruins MD, 4:35 PM 08/24/2019

## 2019-08-24 NOTE — Patient Instructions (Signed)
1. Encounter for routine gynecological examination with Papanicolaou smear of cervix Gynecologic exam status post menopause with Rubbie Battiest syndrome.  Pap reflex done.  Breast exam normal.  Last screening mammogram March 2020 was negative.  Declines colonoscopy at this time.  Health labs with family physician.  2. Postmenopausal Well on no hormone replacement therapy.  3. Class 2 obesity due to excess calories without serious comorbidity with body mass index (BMI) of 37.0 to 37.9 in adult Recommend a lower calorie/carb diet such as [employer].  Aerobic physical activities 5 times a week and light weightlifting every 2 days.  Maria Wolfe, it was a pleasure seeing you today!  I will inform you of your results as soon as they are available.

## 2019-08-29 LAB — PAP IG W/ RFLX HPV ASCU

## 2019-08-29 LAB — HUMAN PAPILLOMAVIRUS, HIGH RISK: HPV DNA High Risk: NOT DETECTED

## 2020-03-25 ENCOUNTER — Other Ambulatory Visit: Payer: Self-pay | Admitting: Family Medicine

## 2020-03-25 DIAGNOSIS — Z1231 Encounter for screening mammogram for malignant neoplasm of breast: Secondary | ICD-10-CM

## 2020-04-11 ENCOUNTER — Ambulatory Visit
Admission: RE | Admit: 2020-04-11 | Discharge: 2020-04-11 | Disposition: A | Discharge status: Home / Self Care | Payer: 59 | Source: Ambulatory Visit | Attending: Family Medicine | Admitting: Family Medicine

## 2020-04-11 ENCOUNTER — Other Ambulatory Visit: Payer: Self-pay

## 2020-04-11 DIAGNOSIS — Z1231 Encounter for screening mammogram for malignant neoplasm of breast: Secondary | ICD-10-CM

## 2020-08-06 IMAGING — DX DG CHEST 1V PORT
1 series · 1 of 1 positions shown · non-contrast
Comparison: 09/07/2013

CLINICAL DATA: Chest pain

EXAM:
PORTABLE CHEST 1 VIEW

[chest ap]
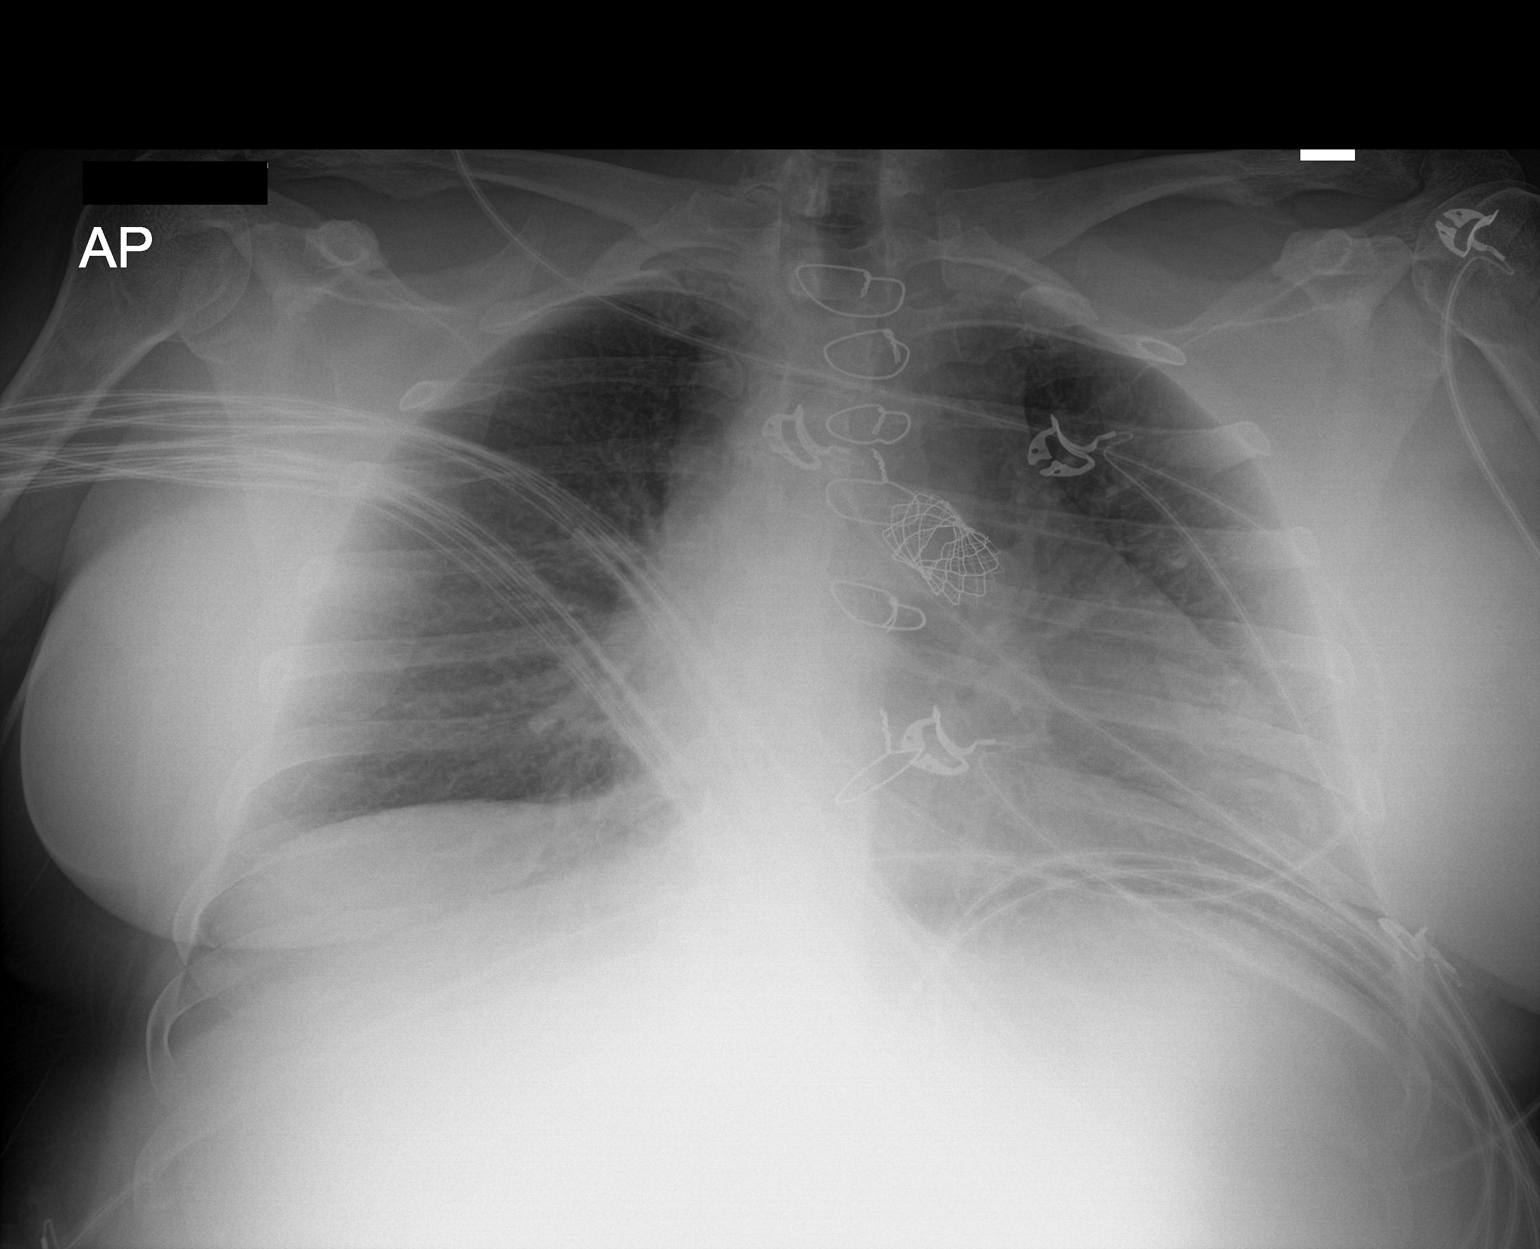

[1 of 1 positions shown; findings below may reference images not displayed]

FINDINGS: Cardiomegaly status post median sternotomy with pulmonic outflow
stent. Both lungs are clear. The visualized skeletal structures are
unremarkable.
IMPRESSION: No acute abnormality of the lungs in AP portable projection.
Cardiomegaly.

## 2020-08-25 ENCOUNTER — Encounter: Payer: 59 | Admitting: Obstetrics & Gynecology

## 2021-05-25 ENCOUNTER — Other Ambulatory Visit: Payer: Self-pay | Admitting: Family Medicine

## 2021-05-25 DIAGNOSIS — Z1231 Encounter for screening mammogram for malignant neoplasm of breast: Secondary | ICD-10-CM

## 2021-07-03 ENCOUNTER — Other Ambulatory Visit: Payer: Self-pay

## 2021-07-03 ENCOUNTER — Ambulatory Visit
Admission: RE | Admit: 2021-07-03 | Discharge: 2021-07-03 | Disposition: A | Discharge status: Home / Self Care | Payer: 59 | Source: Ambulatory Visit | Attending: Family Medicine | Admitting: Family Medicine

## 2021-07-03 DIAGNOSIS — Z1231 Encounter for screening mammogram for malignant neoplasm of breast: Secondary | ICD-10-CM

## 2022-07-08 ENCOUNTER — Ambulatory Visit (INDEPENDENT_AMBULATORY_CARE_PROVIDER_SITE_OTHER): Payer: 59 | Admitting: Podiatry

## 2022-07-08 ENCOUNTER — Encounter: Payer: Self-pay | Admitting: Podiatry

## 2022-07-08 VITALS — BP 164/88 | HR 81

## 2022-07-08 DIAGNOSIS — B353 Tinea pedis: Secondary | ICD-10-CM | POA: Diagnosis not present

## 2022-07-08 DIAGNOSIS — L603 Nail dystrophy: Secondary | ICD-10-CM

## 2022-07-11 NOTE — Progress Notes (Signed)
Subjective:  Patient ID: Maria Wolfe, female    DOB: [birth year],  MRN: [id number] HPI Chief Complaint  Patient presents with   Nail Problem    Hallux right/skin right - toenail thick and discolored x years, tender with shoes sometimes due to thickness, skin is peeling, tried lotion for skin-no help, cream Rx'd buy dermatologist years ago and burned skin so D/C'd   New Patient (Initial Visit)    57 y.o. female presents with the above complaint.   ROS: Denies fever chills nausea vomit muscle aches pains calf pain back pain chest pain shortness of breath.  Past Medical History:  Diagnosis Date   Aortic stenosis    Bursitis of hip    right   Drug-induced nausea and vomiting 03/21/2019   Hypertension    Ovarian cyst    Left   Rudimentary uterus    Agenesis of vagina   Past Surgical History:  Procedure Laterality Date   CARDIAC CATHETERIZATION     CARDIAC SURGERY  2002   Valve replacement   CARDIAC VALVE SURGERY     melody valve   PELVIC LAPAROSCOPY  91,97    Diag. Lap X 2-Ovarian cyst   ROSS KONNO PROCEDURE     WRIST SURGERY      Current Outpatient Medications:    acetaminophen (TYLENOL) 500 MG tablet, Take 500 mg by mouth every 6 (six) hours as needed (for pain.)., Disp: , Rfl:    aspirin 81 MG tablet, Take 81 mg by mouth at bedtime. , Disp: , Rfl:    atorvastatin (LIPITOR) 40 MG tablet, Take 40 mg by mouth daily., Disp: , Rfl:    cetirizine (ZYRTEC) 10 MG tablet, Take 10 mg by mouth daily as needed (for sinus issues.)., Disp: , Rfl:    hydrochlorothiazide (HYDRODIURIL) 25 MG tablet, Take 25 mg by mouth at bedtime. , Disp: , Rfl:    spironolactone (ALDACTONE) 50 MG tablet, Take 50 mg by mouth at bedtime., Disp: , Rfl:   Allergies  Allergen Reactions   Lisinopril Other (See Comments)    Developed presumed drug induced pancreatitis after taking    Metronidazole Diarrhea, Other (See Comments) and Nausea And Vomiting   Amoxicillin Rash    Has patient had a PCN  reaction causing immediate rash, facial/tongue/throat swelling, SOB or lightheadedness with hypotension: Yes Has patient had a PCN reaction causing severe rash involving mucus membranes or skin necrosis: No Has patient had a PCN reaction that required hospitalization: No Has patient had a PCN reaction occurring within the last 10 years: No If all of the above answers are "NO", then may proceed with Cephalosporin use.    Penicillins Rash    Has patient had a PCN reaction causing immediate rash, facial/tongue/throat swelling, SOB or lightheadedness with hypotension: Yes Has patient had a PCN reaction causing severe rash involving mucus membranes or skin necrosis: No Has patient had a PCN reaction that required hospitalization: No Has patient had a PCN reaction occurring within the last 10 years: No If all of the above answers are "NO", then may proceed with Cephalosporin use.     Review of Systems Objective:   Vitals:   07/08/22 1052  BP: (!) 164/88  Pulse: 81    General: Well developed, nourished, in no acute distress, alert and oriented x3   Dermatological: Skin is warm, dry and supple bilateral. Nails x 10 are well maintained; remaining integument appears unremarkable at this time.  Thick dystrophic possibly mycotic hallux nail.  Dry xerotic  skin plantar aspect of the foot most likely tinea pedis.  Vascular: Dorsalis Pedis artery and Posterior Tibial artery pedal pulses are 2/4 bilateral with immedate capillary fill time. Pedal hair growth present. No varicosities and no lower extremity edema present bilateral.   Neruologic: Grossly intact via light touch bilateral. Vibratory intact via tuning fork bilateral. Protective threshold with Semmes Wienstein monofilament intact to all pedal sites bilateral. Patellar and Achilles deep tendon reflexes 2+ bilateral. No Babinski or clonus noted bilateral.   Musculoskeletal: No gross boney pedal deformities bilateral. No pain, crepitus, or  limitation noted with foot and ankle range of motion bilateral. Muscular strength 5/5 in all groups tested bilateral.  Gait: Unassisted, Nonantalgic.    Radiographs:  None taken  Assessment & Plan:   Assessment: Most likely nail dystrophy and tinea pedis.  Plan: Samples of skin and nail were taken today for pathologic evaluation.  Follow-up with her in 1 month     Aquil Duhe T. [place], [place]

## 2022-07-21 ENCOUNTER — Telehealth: Payer: Self-pay | Admitting: *Deleted

## 2022-07-21 NOTE — Telephone Encounter (Signed)
Called patient giving positive fungus,verbalized understanding, will discuss next step moving forward at upcoming appointment.

## 2022-08-05 ENCOUNTER — Encounter: Payer: Self-pay | Admitting: Podiatry

## 2022-08-05 ENCOUNTER — Ambulatory Visit (INDEPENDENT_AMBULATORY_CARE_PROVIDER_SITE_OTHER): Payer: 59 | Admitting: Podiatry

## 2022-08-05 DIAGNOSIS — L603 Nail dystrophy: Secondary | ICD-10-CM | POA: Diagnosis not present

## 2022-08-05 DIAGNOSIS — Z79899 Other long term (current) drug therapy: Secondary | ICD-10-CM

## 2022-08-05 MED ORDER — TERBINAFINE HCL 250 MG PO TABS: 250.0000 mg | ORAL_TABLET | Freq: Every day | ORAL | 0 refills | Status: AC

## 2022-08-05 NOTE — Patient Instructions (Signed)
Terbinafine Tablets What is this medication? TERBINAFINE (TER bin a feen) treats fungal infections of the nails. It belongs to a group of medications called antifungals. It will not treat infections caused by bacteria or viruses. This medicine may be used for other purposes; ask your health care provider or pharmacist if you have questions. COMMON BRAND NAME(S): Lamisil, Terbinex What should I tell my care team before I take this medication? They need to know if you have any of these conditions: Liver disease An unusual or allergic reaction to terbinafine, other medications, foods, dyes, or preservatives Pregnant or trying to get pregnant Breast-feeding How should I use this medication? Take this medication by mouth with water. Take it as directed on the prescription label at the same time every day. You can take it with or without food. If it upsets your stomach, take it with food. Keep taking it unless your care team tells you to stop. A special MedGuide will be given to you by the pharmacist with each prescription and refill. Be sure to read this information carefully each time. Talk to your care team regarding the use of this medication in children. Special care may be needed. Overdosage: If you think you have taken too much of this medicine contact a poison control center or emergency room at once. NOTE: This medicine is only for you. Do not share this medicine with others. What if I miss a dose? If you miss a dose, take it as soon as you can unless it is more than 4 hours late. If it is more than 4 hours late, skip the missed dose. Take the next dose at the normal time. What may interact with this medication? Do not take this medication with any of the following: Pimozide Thioridazine This medication may also interact with the following: Beta blockers Caffeine Certain medications for mental health conditions Cimetidine Cyclosporine Medications for fungal infections like fluconazole  and ketoconazole Medications for irregular heartbeat like amiodarone, flecainide and propafenone Rifampin Warfarin This list may not describe all possible interactions. Give your health care provider a list of all the medicines, herbs, non-prescription drugs, or dietary supplements you use. Also tell them if you smoke, drink alcohol, or use illegal drugs. Some items may interact with your medicine. What should I watch for while using this medication? Visit your care team for regular checks on your progress. You may need blood work while you are taking this medication. It may be some time before you see the benefit from this medication. This medication may cause serious skin reactions. They can happen weeks to months after starting the medication. Contact your care team right away if you notice fevers or flu-like symptoms with a rash. The rash may be red or purple and then turn into blisters or peeling of the skin. Or, you might notice a red rash with swelling of the face, lips or lymph nodes in your neck or under your arms. This medication can make you more sensitive to the sun. Keep out of the sun, If you cannot avoid being in the sun, wear protective clothing and sunscreen. Do not use sun lamps or tanning beds/booths. What side effects may I notice from receiving this medication? Side effects that you should report to your care team as soon as possible: Allergic reactions--skin rash, itching, hives, swelling of the face, lips, tongue, or throat Change in sense of smell Change in taste Infection--fever, chills, cough, or sore throat Liver injury--right upper belly pain, loss of appetite, nausea,  light-colored stool, dark yellow or brown urine, yellowing skin or eyes, unusual weakness or fatigue Low red blood cell level--unusual weakness or fatigue, dizziness, headache, trouble breathing Lupus-like syndrome--joint pain, swelling, or stiffness, butterfly-shaped rash on the face, rashes that get worse  in the sun, fever, unusual weakness or fatigue Rash, fever, and swollen lymph nodes Redness, blistering, peeling, or loosening of the skin, including inside the mouth Unusual bruising or bleeding Worsening mood, feelings of depression Side effects that usually do not require medical attention (report to your care team if they continue or are bothersome): Diarrhea Gas Headache Nausea Stomach pain Upset stomach This list may not describe all possible side effects. Call your doctor for medical advice about side effects. You may report side effects to FDA at 1-800-FDA-1088. Where should I keep my medication? Keep out of the reach of children and pets. Store between 20 and 25 degrees C (68 and 77 degrees F). Protect from light. Get rid of any unused medication after the expiration date. To get rid of medications that are no longer needed or have expired: Take the medication to a medication take-back program. Check with your pharmacy or law enforcement to find a location. If you cannot return the medication, check the label or package insert to see if the medication should be thrown out in the garbage or flushed down the toilet. If you are not sure, ask your care team. If it is safe to put it in the trash, take the medication out of the container. Mix the medication with cat litter, dirt, coffee grounds, or other unwanted substance. Seal the mixture in a bag or container. Put it in the trash. NOTE: This sheet is a summary. It may not cover all possible information. If you have questions about this medicine, talk to your doctor, pharmacist, or health care provider.  2023 Elsevier/Gold Standard (2021-01-13 00:00:00)

## 2022-08-07 NOTE — Progress Notes (Signed)
She presents today for follow-up of her nail pathology.  Objective: No change in physical exam pathology does demonstrate nail dystrophy with Trichophyton rubrum and Candida.  Assessment: Nail dystrophy nail fungus and yeast.  Plan: Discussed oral therapy laser therapy and topical therapy.  At this point were going to start her oral Lamisil 250 mg tablets.  She will take 1 tablet by mouth nightly.  We are going to request blood work sent since none has been done for quite some time I will follow-up with her should this come back abnormal otherwise we will see her in 1 month for another set of blood work.

## 2022-09-14 ENCOUNTER — Ambulatory Visit: Payer: 59 | Admitting: Podiatry

## 2022-10-11 ENCOUNTER — Ambulatory Visit: Payer: 59 | Admitting: Podiatry

## 2022-11-18 ENCOUNTER — Other Ambulatory Visit: Payer: Self-pay | Admitting: Obstetrics & Gynecology

## 2022-11-18 DIAGNOSIS — Z1231 Encounter for screening mammogram for malignant neoplasm of breast: Secondary | ICD-10-CM

## 2022-11-19 ENCOUNTER — Ambulatory Visit
Admission: RE | Admit: 2022-11-19 | Discharge: 2022-11-19 | Disposition: A | Discharge status: Home / Self Care | Payer: 59 | Source: Ambulatory Visit | Attending: Obstetrics & Gynecology | Admitting: Obstetrics & Gynecology

## 2022-11-19 DIAGNOSIS — Z1231 Encounter for screening mammogram for malignant neoplasm of breast: Secondary | ICD-10-CM

## 2022-12-08 ENCOUNTER — Other Ambulatory Visit: Payer: Self-pay | Admitting: Pain Medicine

## 2022-12-08 DIAGNOSIS — R87619 Unspecified abnormal cytological findings in specimens from cervix uteri: Secondary | ICD-10-CM

## 2022-12-31 ENCOUNTER — Ambulatory Visit
Admission: RE | Admit: 2022-12-31 | Discharge: 2022-12-31 | Disposition: A | Discharge status: Home / Self Care | Payer: No Typology Code available for payment source | Source: Ambulatory Visit | Attending: Pain Medicine | Admitting: Pain Medicine

## 2022-12-31 DIAGNOSIS — R87619 Unspecified abnormal cytological findings in specimens from cervix uteri: Secondary | ICD-10-CM

## 2024-01-23 ENCOUNTER — Other Ambulatory Visit: Payer: Self-pay | Admitting: Family Medicine

## 2024-01-23 DIAGNOSIS — Z1231 Encounter for screening mammogram for malignant neoplasm of breast: Secondary | ICD-10-CM

## 2024-02-07 ENCOUNTER — Ambulatory Visit
Admission: RE | Admit: 2024-02-07 | Discharge: 2024-02-07 | Disposition: A | Discharge status: Home / Self Care | Source: Ambulatory Visit | Attending: Family Medicine | Admitting: Family Medicine

## 2024-02-07 DIAGNOSIS — Z1231 Encounter for screening mammogram for malignant neoplasm of breast: Secondary | ICD-10-CM

## 2024-03-24 ENCOUNTER — Other Ambulatory Visit: Payer: Self-pay

## 2024-03-24 ENCOUNTER — Emergency Department (HOSPITAL_BASED_OUTPATIENT_CLINIC_OR_DEPARTMENT_OTHER): Admission: EM | Admit: 2024-03-24 | Discharge: 2024-03-24 | Disposition: A | Discharge status: Home / Self Care

## 2024-03-24 ENCOUNTER — Encounter (HOSPITAL_BASED_OUTPATIENT_CLINIC_OR_DEPARTMENT_OTHER): Payer: Self-pay

## 2024-03-24 ENCOUNTER — Emergency Department (HOSPITAL_BASED_OUTPATIENT_CLINIC_OR_DEPARTMENT_OTHER)

## 2024-03-24 DIAGNOSIS — I10 Essential (primary) hypertension: Secondary | ICD-10-CM | POA: Diagnosis not present

## 2024-03-24 DIAGNOSIS — Z79899 Other long term (current) drug therapy: Secondary | ICD-10-CM | POA: Diagnosis not present

## 2024-03-24 DIAGNOSIS — Z7982 Long term (current) use of aspirin: Secondary | ICD-10-CM | POA: Diagnosis not present

## 2024-03-24 DIAGNOSIS — R109 Unspecified abdominal pain: Secondary | ICD-10-CM | POA: Diagnosis present

## 2024-03-24 DIAGNOSIS — R1084 Generalized abdominal pain: Secondary | ICD-10-CM | POA: Insufficient documentation

## 2024-03-24 LAB — CBC
HCT: 42.9 % (ref 36.0–46.0)
Hemoglobin: 15 g/dL (ref 12.0–15.0)
MCH: 30.2 pg (ref 26.0–34.0)
MCHC: 35 g/dL (ref 30.0–36.0)
MCV: 86.3 fL (ref 80.0–100.0)
Platelets: 223 K/uL (ref 150–400)
RBC: 4.97 MIL/uL (ref 3.87–5.11)
RDW: 14.1 % (ref 11.5–15.5)
WBC: 5.9 K/uL (ref 4.0–10.5)
nRBC: 0 % (ref 0.0–0.2)

## 2024-03-24 LAB — URINALYSIS, MICROSCOPIC (REFLEX): WBC, UA: NONE SEEN WBC/hpf (ref 0–5)

## 2024-03-24 LAB — URINALYSIS, ROUTINE W REFLEX MICROSCOPIC
Bilirubin Urine: NEGATIVE
Glucose, UA: NEGATIVE mg/dL
Ketones, ur: NEGATIVE mg/dL
Leukocytes,Ua: NEGATIVE
Nitrite: NEGATIVE
Protein, ur: NEGATIVE mg/dL
Specific Gravity, Urine: 1.02 (ref 1.005–1.030)
pH: 7 (ref 5.0–8.0)

## 2024-03-24 LAB — COMPREHENSIVE METABOLIC PANEL WITH GFR
ALT: 36 U/L (ref 0–44)
AST: 29 U/L (ref 15–41)
Albumin: 4.6 g/dL (ref 3.5–5.0)
Alkaline Phosphatase: 122 U/L (ref 38–126)
Anion gap: 13 (ref 5–15)
BUN: 13 mg/dL (ref 6–20)
CO2: 25 mmol/L (ref 22–32)
Calcium: 9.9 mg/dL (ref 8.9–10.3)
Chloride: 102 mmol/L (ref 98–111)
Creatinine, Ser: 1.11 mg/dL — ABNORMAL HIGH (ref 0.44–1.00)
GFR, Estimated: 58 mL/min — ABNORMAL LOW (ref >60)
Glucose, Bld: 119 mg/dL — ABNORMAL HIGH (ref 70–99)
Potassium: 4 mmol/L (ref 3.5–5.1)
Sodium: 139 mmol/L (ref 135–145)
Total Bilirubin: 1 mg/dL (ref 0.0–1.2)
Total Protein: 8 g/dL (ref 6.5–8.1)

## 2024-03-24 LAB — LIPASE, BLOOD: Lipase: 49 U/L (ref 11–51)

## 2024-03-24 MED ORDER — DICYCLOMINE HCL 20 MG PO TABS: 20.0000 mg | ORAL_TABLET | Freq: Two times a day (BID) | ORAL | 0 refills | Status: AC

## 2024-03-24 MED ORDER — IOHEXOL 300 MG/ML  SOLN
100.0000 mL | Freq: Once | INTRAMUSCULAR | Status: AC | PRN
  Administered 2024-03-24: 100 mL via INTRAVENOUS

## 2024-03-24 NOTE — Discharge Instructions (Addendum)
 Please read and follow all provided instructions.  Your diagnoses today include:  1. Generalized abdominal pain     Tests performed today include: Complete blood cell count: Normal white blood cells Complete metabolic panel: Renal function and blood sugar were slightly high, but similar to previous Lipase (pancreas function test): Was normal Urinalysis (urine test): No signs of infection CT scan did not show any emergencies today.  You do have a small adrenal mass on the left side which is likely benign, but radiology recommends reimaging in 1 year to ensure that this is stable.  Also mild fatty liver disease.  These incidental findings can be addressed by primary care. Vital signs. See below for your results today.   Medications prescribed:  Bentyl  - medication for intestinal cramps and spasms  Miralax - laxative  This medication can be found over-the-counter.   Take any prescribed medications only as directed.  Home care instructions:  Follow any educational materials contained in this packet.  Follow-up instructions: Please follow-up with your primary care provider in the next 7 days for further evaluation of your symptoms.    Return instructions:  SEEK IMMEDIATE MEDICAL ATTENTION IF: The pain does not go away or becomes severe  A temperature above 101F develops  Repeated vomiting occurs (multiple episodes)  The pain becomes localized to portions of the abdomen. The right side could possibly be appendicitis. In an adult, the left lower portion of the abdomen could be colitis or diverticulitis.  Blood is being passed in stools or vomit (bright red or black tarry stools)  You develop chest pain, difficulty breathing, dizziness or fainting, or become confused, poorly responsive, or inconsolable (young children) If you have any other emergent concerns regarding your health  Additional Information: Abdominal (belly) pain can be caused by many things. Your caregiver performed an  examination and possibly ordered blood/urine tests and imaging (CT scan, x-rays, ultrasound). Many cases can be observed and treated at home after initial evaluation in the emergency department. Even though you are being discharged home, abdominal pain can be unpredictable. Therefore, you need a repeated exam if your pain does not resolve, returns, or worsens. Most patients with abdominal pain don't have to be admitted to the hospital or have surgery, but serious problems like appendicitis and gallbladder attacks can start out as nonspecific pain. Many abdominal conditions cannot be diagnosed in one visit, so follow-up evaluations are very important.  Your vital signs today were: BP (!) 149/87 (BP Location: Left Arm)   Pulse 86   Temp 97.6 F (36.4 C) (Oral)   Resp 16   SpO2 97%  If your blood pressure (bp) was elevated above 135/85 this visit, please have this repeated by your doctor within one month. --------------

## 2024-03-24 NOTE — ED Provider Notes (Signed)
 [place] [facility] Provider Note   CSN: [id number] Arrival date & time: 03/24/24  1113     Patient presents with: Abdominal Pain   Maria Wolfe is a 58 y.o. female.   Patient with remote history of open heart surgery for aortic stenosis, reported bulging disks and right hip dysplasia, history of hypertension, high cholesterol --presents to the emergency department today for evaluation of abdominal pain.  Symptoms have been occurring over the past 1 week.  Patient reports migratory abdominal pain that has waxed and waned.  Patient reports decreasing pain over the past day or so.  She does have alternating between constipation and some liquid stool.  She feels like she is emptying incompletely.  She denies history of abdominal surgeries other than a surgery for ovarian cyst in the past.  No urinary symptoms including dysuria, increased frequency or urgency.  No blood in the stool.  Patient was seen at urgent care and referred to the emergency department.       Prior to Admission medications   Medication Sig Start Date End Date Taking? Authorizing Provider  acetaminophen (TYLENOL) 500 MG tablet Take 500 mg by mouth every 6 (six) hours as needed (for pain.).    [provider]  aspirin 81 MG tablet Take 81 mg by mouth at bedtime.     [provider]  atorvastatin (LIPITOR) 40 MG tablet Take 40 mg by mouth daily.    [provider]  cetirizine (ZYRTEC) 10 MG tablet Take 10 mg by mouth daily as needed (for sinus issues.).    [provider]  hydrochlorothiazide (HYDRODIURIL) 25 MG tablet Take 25 mg by mouth at bedtime.     [provider]  spironolactone (ALDACTONE) 50 MG tablet Take 50 mg by mouth at bedtime.    [provider]  terbinafine  (LAMISIL ) 250 MG tablet Take 1 tablet (250 mg total) by mouth daily. 08/05/22   Hyatt, Max T, DPM    Allergies: Lisinopril, Metronidazole, Amoxicillin, and  Penicillins    Review of Systems  Updated Vital Signs BP (!) 149/87 (BP Location: Left Arm)   Pulse 86   Temp 97.6 F (36.4 C) (Oral)   Resp 16   SpO2 97%   Physical Exam Vitals and nursing note reviewed.  Constitutional:      General: She is not in acute distress.    Appearance: She is well-developed.  HENT:     Head: Normocephalic and atraumatic.     Right Ear: External ear normal.     Left Ear: External ear normal.     Nose: Nose normal.  Eyes:     Conjunctiva/sclera: Conjunctivae normal.  Cardiovascular:     Rate and Rhythm: Normal rate and regular rhythm.     Heart sounds: Murmur heard.     Comments: 3/6 systolic murmur heard right and left sternal borders Pulmonary:     Effort: No respiratory distress.     Breath sounds: No wheezing, rhonchi or rales.  Abdominal:     Palpations: Abdomen is soft.     Tenderness: There is abdominal tenderness. There is no guarding or rebound.     Comments: Mild tenderness, non-localized  Musculoskeletal:     Cervical back: Normal range of motion and neck supple.     Right lower leg: No edema.     Left lower leg: No edema.  Skin:    General: Skin is warm and dry.     Findings: No  rash.  Neurological:     General: No focal deficit present.     Mental Status: She is alert. Mental status is at baseline.     Motor: No weakness.  Psychiatric:        Mood and Affect: Mood normal.     (all labs ordered are listed, but only abnormal results are displayed) Labs Reviewed  COMPREHENSIVE METABOLIC PANEL WITH GFR - Abnormal; Notable for the following components:      Result Value   Glucose, Bld 119 (*)    Creatinine, Ser 1.11 (*)    GFR, Estimated 58 (*)    All other components within normal limits  URINALYSIS, ROUTINE W REFLEX MICROSCOPIC - Abnormal; Notable for the following components:   Hgb urine dipstick TRACE (*)    All other components within normal limits  URINALYSIS, MICROSCOPIC (REFLEX) - Abnormal; Notable for the  following components:   Bacteria, UA RARE (*)    All other components within normal limits  LIPASE, BLOOD  CBC    EKG: None  Radiology: CT ABDOMEN PELVIS W CONTRAST Result Date: 03/24/2024 CLINICAL DATA:  Abdominal pain for 1 week.  Diarrhea. EXAM: CT ABDOMEN AND PELVIS WITH CONTRAST TECHNIQUE: Multidetector CT imaging of the abdomen and pelvis was performed using the standard protocol following bolus administration of intravenous contrast. RADIATION DOSE REDUCTION: This exam was performed according to the departmental dose-optimization program which includes automated exposure control, adjustment of the mA and/or kV according to patient size and/or use of iterative reconstruction technique. CONTRAST:  OMNIPAQUE  IOHEXOL  300 MG/ML  SOLN COMPARISON:  09/15/2015 FINDINGS: Lower Chest: No acute findings. Hepatobiliary: No suspicious hepatic masses identified. Mild diffuse hepatic steatosis. Gallbladder is unremarkable. No evidence of biliary ductal dilatation. Pancreas:  No mass or inflammatory changes. Spleen: Within normal limits in size and appearance. Adrenals/Urinary Tract: 1.4 cm homogeneous left adrenal mass, new since prior study in 2017. This has nonspecific enhancement characteristics, but likely represents a benign adenoma. Another small nodular area in the left adrenal gland remains stable. No renal masses identified. No evidence of ureteral calculi or hydronephrosis. Unremarkable unopacified urinary bladder. Stomach/Bowel: No evidence of obstruction, inflammatory process or abnormal fluid collections. Vascular/Lymphatic: No pathologically enlarged lymph nodes. No acute vascular findings. Reproductive: Prior hysterectomy noted. Adnexal regions are unremarkable in appearance. Other:  None. Musculoskeletal:  No suspicious bone lesions identified. IMPRESSION: No acute findings within the abdomen or pelvis. Mild hepatic steatosis. 1.4 cm indeterminate left adrenal mass. In patient without  cancer history, this is probably a benign adenoma, and followup CT should be considered in 12 months to confirm stability. If patient does have a history of cancer, recommend further evaluation with adrenal protocol abdomen CT without and with contrast. (This recommendation follows ACR consensus guidelines: Management of Incidental Adrenal Masses: A White Paper of the ACR Incidental Findings Committee. J Am Coll Radiol 2017;14:1038-1044.) Electronically Signed   By: Norleen DELENA Kil M.D.   On: 03/24/2024 13:28     Procedures   Medications Ordered in the ED - No data to display  ED Course  Patient seen and examined. History obtained directly from patient. Work-up including labs, imaging, EKG ordered in triage, if performed, were reviewed.    Labs/EKG: Independently reviewed and interpreted.  This included: CBC with normal white blood cell count and hemoglobin, lipase, CMP, UA pending.  Imaging: Ordered CT abdomen pelvis  Medications/Fluids: None ordered  Most recent vital signs reviewed and are as follows: BP (!) 149/87 (BP Location: Left Arm)  Pulse 86   Temp 97.6 F (36.4 C) (Oral)   Resp 16   SpO2 97%   Initial impression: New nonspecific abdominal pain with bowel changes for the past 1 week, we will proceed with imaging to rule out emergent causes of symptoms.  1:47 PM Reassessment performed. Patient appears stable, comfortable sitting on bedside.  Labs personally reviewed and interpreted including: CBC unremarkable with normal white blood cell count and hemoglobin; CMP creatinine mildly elevated at 1.11 but similar to previous, glucose minimally elevated at 119 otherwise unremarkable liver and kidney function; lipase normal; UA negative.  Imaging personally visualized and interpreted including: CT abdomen pelvis, agree no acute findings, adrenal adenoma noted, mild hepatic steatosis.  Reviewed pertinent lab work and imaging with patient at bedside.  We did discuss incidental  findings.  Discussed follow-up with PCP, but adrenal finding can likely be assessed in about a year for stability.  Questions answered.   Most current vital signs reviewed and are as follows: BP (!) 149/87 (BP Location: Left Arm)   Pulse 86   Temp 97.6 F (36.4 C) (Oral)   Resp 16   SpO2 97%   Plan: Discharge to home.   Prescriptions written for: Trial Bentyl , over-the-counter MiraLAX  Other home care instructions discussed: Benjamine diet, trial Bentyl  for more severe episodes of pain  ED return instructions discussed: The patient was urged to return to the Emergency Department immediately with worsening of current symptoms, worsening abdominal pain, persistent vomiting, blood noted in stools, fever, or any other concerns. The patient verbalized understanding.   Follow-up instructions discussed: Patient encouraged to follow-up with their PCP in 7 days.  GI referral information given if desired.                                   Medical Decision Making Amount and/or Complexity of Data Reviewed Labs: ordered. Radiology: ordered.  Risk Prescription drug management.   For this patient's complaint of abdominal pain, the following conditions were considered on the differential diagnosis: gastritis/PUD, enteritis/duodenitis, appendicitis, cholelithiasis/cholecystitis, cholangitis, pancreatitis, ruptured viscus, colitis, diverticulitis, small/large bowel obstruction, proctitis, cystitis, pyelonephritis, ureteral colic, aortic dissection, aortic aneurysm. In women, ectopic pregnancy, pelvic inflammatory disease, ovarian cysts, and tubo-ovarian abscess were also considered. Atypical chest etiologies were also considered including ACS, PE, and pneumonia.  Lab workup and imaging today is reassuring, no acute findings to explain the patient's pain noted.  Will treat symptomatically.  Given that she feels somewhat constipated at times, will trial daily MiraLAX and give Bentyl  for acute episodes of  pain and spasm.  The patient's vital signs, pertinent lab work and imaging were reviewed and interpreted as discussed in the ED course. Hospitalization was considered for further testing, treatments, or serial exams/observation. However as patient is well-appearing, has a stable exam, and reassuring studies today, I do not feel that they warrant admission at this time. This plan was discussed with the patient who verbalizes agreement and comfort with this plan and seems reliable and able to return to the Emergency Department with worsening or changing symptoms.        Final diagnoses:  Generalized abdominal pain    ED Discharge Orders          Ordered    dicyclomine  (BENTYL ) 20 MG tablet  2 times daily        03/24/24 1344  Desiderio Chew, PA-C 03/24/24 1349    Ula Prentice SAUNDERS, MD 03/24/24 [phone]

## 2024-03-24 NOTE — ED Triage Notes (Signed)
 Generalized abd pain for 1 week. Intermittent diarrhea. Denies N/V, urinary symptoms.
